# Patient Record
Sex: Male | Born: 1940 | Race: White | Hispanic: No | Marital: Married | Smoking: Never smoker
Health system: Southern US, Community
[De-identification: ages and names within clinical notes are randomized; demographics above are authoritative.]

## PROBLEM LIST (undated history)

## (undated) DIAGNOSIS — Z87442 Personal history of urinary calculi: Secondary | ICD-10-CM

## (undated) DIAGNOSIS — K8020 Calculus of gallbladder without cholecystitis without obstruction: Secondary | ICD-10-CM

## (undated) DIAGNOSIS — M1990 Unspecified osteoarthritis, unspecified site: Secondary | ICD-10-CM

## (undated) DIAGNOSIS — Z7901 Long term (current) use of anticoagulants: Secondary | ICD-10-CM

## (undated) DIAGNOSIS — I10 Essential (primary) hypertension: Secondary | ICD-10-CM

## (undated) DIAGNOSIS — N400 Enlarged prostate without lower urinary tract symptoms: Secondary | ICD-10-CM

## (undated) DIAGNOSIS — I82409 Acute embolism and thrombosis of unspecified deep veins of unspecified lower extremity: Secondary | ICD-10-CM

## (undated) HISTORY — PX: BACK SURGERY: SHX140

## (undated) HISTORY — PX: HERNIA REPAIR: SHX51

## (undated) HISTORY — PX: EYE SURGERY: SHX253

---

## 1999-04-05 ENCOUNTER — Emergency Department (HOSPITAL_COMMUNITY): Admission: EM | Admit: 1999-04-05 | Discharge: 1999-04-05 | Payer: Self-pay | Admitting: Emergency Medicine

## 2000-10-25 ENCOUNTER — Ambulatory Visit (HOSPITAL_COMMUNITY): Admission: RE | Admit: 2000-10-25 | Discharge: 2000-10-25 | Payer: Self-pay | Admitting: Orthopedic Surgery

## 2000-10-25 ENCOUNTER — Encounter: Payer: Self-pay | Admitting: Orthopedic Surgery

## 2000-11-23 ENCOUNTER — Encounter (INDEPENDENT_AMBULATORY_CARE_PROVIDER_SITE_OTHER): Payer: Self-pay

## 2000-11-23 ENCOUNTER — Other Ambulatory Visit: Admission: RE | Admit: 2000-11-23 | Discharge: 2000-11-23 | Payer: Self-pay | Admitting: Urology

## 2000-11-26 ENCOUNTER — Encounter: Payer: Self-pay | Admitting: Orthopedic Surgery

## 2000-11-26 ENCOUNTER — Ambulatory Visit (HOSPITAL_COMMUNITY): Admission: RE | Admit: 2000-11-26 | Discharge: 2000-11-26 | Payer: Self-pay | Admitting: Orthopedic Surgery

## 2000-12-10 ENCOUNTER — Encounter: Payer: Self-pay | Admitting: Orthopedic Surgery

## 2000-12-10 ENCOUNTER — Ambulatory Visit (HOSPITAL_COMMUNITY): Admission: RE | Admit: 2000-12-10 | Discharge: 2000-12-10 | Payer: Self-pay | Admitting: Orthopedic Surgery

## 2001-09-21 ENCOUNTER — Ambulatory Visit (HOSPITAL_COMMUNITY): Admission: RE | Admit: 2001-09-21 | Discharge: 2001-09-21 | Payer: Self-pay | Admitting: Orthopedic Surgery

## 2001-09-21 ENCOUNTER — Encounter: Payer: Self-pay | Admitting: Orthopedic Surgery

## 2001-10-28 ENCOUNTER — Encounter: Payer: Self-pay | Admitting: Orthopedic Surgery

## 2001-10-28 ENCOUNTER — Inpatient Hospital Stay (HOSPITAL_COMMUNITY): Admission: RE | Admit: 2001-10-28 | Discharge: 2001-10-30 | Payer: Self-pay | Admitting: Orthopedic Surgery

## 2001-10-28 ENCOUNTER — Encounter (INDEPENDENT_AMBULATORY_CARE_PROVIDER_SITE_OTHER): Payer: Self-pay | Admitting: *Deleted

## 2001-11-04 ENCOUNTER — Encounter: Payer: Self-pay | Admitting: Emergency Medicine

## 2001-11-04 ENCOUNTER — Inpatient Hospital Stay (HOSPITAL_COMMUNITY): Admission: EM | Admit: 2001-11-04 | Discharge: 2001-11-10 | Payer: Self-pay | Admitting: Emergency Medicine

## 2004-01-31 ENCOUNTER — Encounter: Admission: RE | Admit: 2004-01-31 | Discharge: 2004-01-31 | Payer: Self-pay | Admitting: Emergency Medicine

## 2004-02-13 ENCOUNTER — Encounter: Admission: RE | Admit: 2004-02-13 | Discharge: 2004-02-13 | Payer: Self-pay | Admitting: Emergency Medicine

## 2004-04-15 ENCOUNTER — Encounter: Admission: RE | Admit: 2004-04-15 | Discharge: 2004-04-15 | Payer: Self-pay | Admitting: Emergency Medicine

## 2004-05-29 ENCOUNTER — Encounter: Admission: RE | Admit: 2004-05-29 | Discharge: 2004-05-29 | Payer: Self-pay | Admitting: Emergency Medicine

## 2008-03-16 ENCOUNTER — Ambulatory Visit: Payer: Self-pay | Admitting: Cardiology

## 2008-03-27 ENCOUNTER — Ambulatory Visit: Payer: Self-pay

## 2008-03-27 ENCOUNTER — Encounter: Payer: Self-pay | Admitting: Cardiology

## 2011-04-28 NOTE — Assessment & Plan Note (Signed)
[facility]                            CARDIOLOGY OFFICE NOTE   ELVAN, EBRON                     MRN:          [id number]  DATE:03/16/2008                            DOB:          [birth year]    The patient is a 70 year old male who I am asked to evaluate for an  abnormal electrocardiogram.  He has no prior cardiac history.  He  exercises routinely walking 20 minutes on a treadmill each day and also  lifting weights. He typically does not have dyspnea on exertion,  orthopnea, PND, pedal edema, palpitations, presyncope, syncope or chest  pain.  He does state when he feels stressed he can occasionally feel a  tingling sensation in his chest that lasts for 1-2 seconds and resolves  spontaneously.  There is no associated nausea, vomiting, shortness of  breath or diaphoresis.  The symptoms do not radiate.  Again he does not  have any exertional chest pain.  He was recently seen and there was a  question of an abnormality on his electrocardiogram.  Cardiology was  therefore asked to further evaluate.  His medications include Coumadin  as directed. He does have a history of DVT.  He also is on Avodart. He  has no known drug allergies.   SOCIAL HISTORY:  He does not smoke.  He occasionally consumes alcohol.  His family history is significant for coronary artery disease in his  parents but this is at an older age.  He also had a sister who had heart  failure but this was at 72 years of age.   PAST MEDICAL HISTORY:  Significant for a history of borderline  hypertension by his report.  There is no diabetes mellitus or  hyperlipidemia.  He has a history of an umbilical and inguinal hernia  repair.  He also has had previous back surgery which was complicated by  DVT.  He is on Coumadin for that issue. He has benign prostatic  hypertrophy.   REVIEW OF SYSTEMS:  There is no fevers, chills or productive cough.  There is no dysphagia, odynophagia, melena  or hematochezia. There is no  dysuria or hematuria. There is no rash or seizure activity. There is no  orthopnea, PND or pedal edema.  He can occasionally have pedal edema in  the left lower extremity towards the end of the day which resolves  overnight.  This is related to his previous DVT.  There is no  claudication.  The remaining systems are negative.   PHYSICAL EXAMINATION:  Blood pressure that is elevated at 165/85.  His  blood pressure is 79.  He weighs 184 pounds.  He is well-developed and  well-nourished in no acute distress. Skin is warm and dry.  He does not  appear to be depressed and there is no peripheral clubbing. His back is  normal.  HEENT:  Normal with normal eyelids.  NECK:  Supple with normal upstrokes bilaterally and no bruits noted.  There is no jugular venous distention and no thyromegaly is noted.  CHEST:  Clear to auscultation with normal expansion.  CARDIOVASCULAR:  Regular rate  and rhythm with normal S1 and S2. There  are no murmurs, rubs or gallops noted.  ABDOMEN:  Nontender, nondistended, positive bowel sounds. No  hepatosplenomegaly and no mass appreciated. There is no abdominal bruit.  He has 2+ femoral pulses bilaterally and no bruits.  EXTREMITIES:  No edema and I can palpate no cords. He has 2+ posterior  tibial pulses bilaterally.  NEUROLOGIC:  Grossly intact.   His electrocardiogram  shows a sinus rhythm at a rate of 69. The axis  normal. There is poor R wave progression and a prior anterior infarct  cannot be excluded.   DIAGNOSES:  1. Abnormal electrocardiogram  - Mr. Nazir electrocardiogram      would most appropriately be interpreted as cannot rule out prior      anterior infarct. However, he has no cardiac symptoms including no      dyspnea on exertion and no exertional chest pain. I will schedule      him to have an echocardiogram to evaluate wall motion. If it is      normal then I do not think we need to pursue further cardiac       workup.  2. Hypertension - his blood pressure is elevated today. He states that      it can be occasionally elevated at home. I have asked him to track      this and to followup with Herb Grays concerning this issue. If his      systolic runs greater than 140 then he will most likely an      antihypertensive. I will leave this to her.  3. History of deep venous thrombosis - he will continue on his      Coumadin and this is being managed by Herb Grays as well.   We will see him back on an as needed basis if his echocardiogram is  normal.     Madolyn Frieze. Jens Som, MD, [facility]  Electronically Signed    BSC/MedQ  DD: 03/16/2008  DT: 03/16/2008  Job #: [id number]   cc:   Tammy R. Collins [place], M.D.

## 2011-05-01 NOTE — H&P (Signed)
[facility]  Patient:    Alexander Smith, Alexander Smith Visit Number: [id number] MRN: [id number]          Service Type: MED Location: 2S 0252 01 Attending Physician:  Roque Lias Dictated by:   Reuben Likes, M.D. Admit Date:  11/04/2001                           History and Physical  CHIEF COMPLAINT:  "Left leg swollen and tight."  HISTORY OF PRESENT ILLNESS:  Alexander Smith is a 70 year old male who has had a two-day history of swelling and sensation of a tight feeling in the left calf and thigh.  The leg hurts to move.  The patient had a lumbar laminectomy a week ago done by Windy Fast A. Darrelyn Hillock, M.D.  Postoperatively the patient has severe urinary retention and wore a Foley catheter for several days.  Verl Dicker, M.D., who took out the catheter, put him on Cipro.  He has had a little dysuria since then.  Today he has noted pleuritic left submammary chest pain.  He denies dyspnea, syncope, cough, or hemoptysis.  He has had no prior history of DVT or pulmonary embolus.  A venous Doppler done in the emergency room reveals extensive DVT extending from the calf to the common femoral vein.  PAST SURGICAL HISTORY:  The patient had a herniorrhaphy in 1997.  PAST MEDICAL HISTORY:  Other Hospitalizations:  None.  Other Illnesses:  The patient has had hypertension and was on some medication prior to his surgery, but his blood pressure is down now and he is not taking any medication for that right now.  MEDICATIONS:  Cipro and Percocet.  ALLERGIES:  No known medication allergies.  FAMILY HISTORY:  His mother died at age 24 of an MI.  His father died at age 58 of  unknown causes.  He has nine brothers, one of whom had a CVA and another one died of leukemia.  He has six sisters who are in good health and four sons in good health.  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  He lives in his own home in [place], [place].   He denies the use of alcohol or tobacco.  REVIEW OF SYSTEMS:  No other systemic, skin, eye, ENT, respiratory, cardiovascular, GI, GU, musculoskeletal, or neurological complaints.  PHYSICAL EXAMINATION:  Blood pressure 157/92, pulse 105 and regular, respirations 20, temperature 99.4 degrees.  GENERAL APPEARANCE:  He is alert and in no distress.  Particularly, there was no respiratory distress.  SKIN:  Clear, warm, and dry.  No rash.  HEENT:  Eyes:  Pupils equal, round, and reactive to light.  Full EOMs.  Fundi benign.  He has bilateral cataracts.  ENT:  The TMs are normal.  No intraoral lesions.  The mucous membranes are moist.  The pharynx is clear.  NECK:  Supple.  No adenopathy or mass.  No JVD.  Carotid pulses full without bruits.  The thyroid is normal.  LUNGS:  Clear to auscultation and percussion, although breath sounds were somewhat diminished bilaterally.  There were no wheezes, rales, or rhonchi.  HEART:  Regular rhythm.  No murmurs, rubs, or gallops.  ABDOMEN:  Soft and nontender without organomegaly or mass.  Bowel sounds were normally active.  BACK:  He has a midline scar over the lower lumbar area which was closed with some staples.  There was no evidence of infection.  RECTAL:  No masses.  The prostate is markedly enlarged.  It is moderately tender.  No nodules were felt.  Stools heme negative.  EXTREMITIES:  The left leg is swollen and red.  There is no heat.  There is a palpable cord, induction, and tenderness to touch from the calf on up to the groin.  The Homans sign is negative.  Posterior tibial pulses are full.  NEUROLOGIC:  Alert and oriented x 3.  Speech is clear and appropriate.  No extremity weakness or tremor.  DTRs are 2+ and symmetrical.  Babinskis downgoing.  Cranial nerves are intact.  LABORATORY DATA:  The CBC reveals a hemoglobin of 13.3 and a white count of 10,400.  The BMP revealed a sodium of 129.  IMPRESSION: 1. Deep venous  thrombosis, left leg. 2. Chest pain, probable pulmonary embolus. 3. Herniated nucleus pulposus of lumbosacral spine, status post lumbar    laminectomy. 4. Benign prostatic hypertrophy and prostatitis. 5. History of hypertension. 6. Hyponatremia.  PLAN: 1. Chest CT. 2. Heparin and Coumadin. 3. Continue p.o. Cipro. Dictated by:   Reuben Likes, M.D. Attending Physician:  Roque Lias DD:  11/04/01 TD:  11/05/01 Job: [id number]

## 2011-05-01 NOTE — H&P (Signed)
[facility]  Patient:    Alexander Smith, Alexander Smith Visit Number: [id number] MRN: [id number]          Service Type: Attending:  Georges Lynch. Darrelyn Hillock, M.D. Dictated by:   Ralene Bathe, P.A. Adm. Date:  10/28/01   CC:         Lesle Chris, M.D.  Verl Dicker, M.D.   History and Physical  DATE OF BIRTH:  1941/04/30  CHIEF COMPLAINT:  Back and right leg pain.  HISTORY OF PRESENT ILLNESS:  This is a 70 year old male who has had a right radicular leg pain with documented herniated nucleus pulposus at L4-5 on the right.  He has undergone extensive conservative management including therapy, epidural steroids, as well as elective nerve root blocks.  He has been on conservative medications with NSAIDS without relief.  He has demonstrated positive straight leg raise on the right, decreased sensation in the right leg compared to the left with intact reflexes.  Repeat MRI did show a herniated disk at L4-5 on the right.  Decision at this time, as he is significantly uncomfortable and unable to gain any sort of relief, is to proceed with surgical decompression.  Risks and benefits have been discussed with the patient at length and he is in agreement and wishes to proceed.  PAST MEDICAL HISTORY:  BPH.  PAST SURGICAL HISTORY:  Hernia repair.  MEDICATIONS:  Percocet p.r.n.  ALLERGIES:  No known drug allergies.  SOCIAL HISTORY:  He is married.  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  He does not smoke or drink.  PRIMARY PHYSICIAN:  Lesle Chris.  UROLOGIST:  Verl Dicker, M.D.  FAMILY HISTORY:  Denies any history of cancers, heart disease, or diabetes.  REVIEW OF SYSTEMS:  The patient has had a recent abscess tooth, treated with a course of antibiotics and completed two weeks ago, and recheck showed this to be cleared.  RESPIRATORY:  No shortness of breath, productive cough, or hemoptysis.  CARDIOVASCULAR:  No chest pain,  angina, or orthopnea. GASTROINTESTINAL:  No nausea, vomiting, no bloody stools.  GENITOURINARY:  He does have urinary frequency as well as nocturia and difficulty starting his stream, which is being looked into by Dr. Boston Service.  PHYSICAL EXAMINATION:  GENERAL:  Well-developed, well-nourished, 70 year old male who walks with an antalgic gait due to his back pain.  VITAL SIGNS:  Multiple blood pressure checks showed his systolic blood pressure to be consistently elevated at 172 up to 182 on the right arm, and 184 to 174 in the left arm, over 94.  NECK:  Supple, no lymphadenopathy, no carotid bruits appreciated on exam.  CHEST:  Clear to auscultation bilaterally.  No rales or rhonchi.  HEART:  Regular rate and rhythm.  No murmurs, gallops, rubs, heaves, or thrills.  ABDOMEN:  Positive bowel sounds, soft, nontender.  EXTREMITIES:  As in history of present illness.  IMPRESSION: 1. Herniated nucleus pulposus, L4-5. 2. Benign prostatic hypertrophy. 3. Systolic high blood pressure.  PLAN:  At this time we will proceed with microdiskectomy at L4-5 on the right on Friday.  I did give him a prescription with this blood pressure reading and he is going by his medical doctor today.  Would like possibly to be started on something preoperatively.  A good deal of his systolic elevation could be from pain control.  At this time we will proceed with surgical decompression and his blood pressure medicines will be per Dr.  Lesle Chris. Dictated by:   Ralene Bathe, P.A. Attending:  Georges Lynch. Darrelyn Hillock, M.D. DD:  10/25/01 TD:  10/25/01 Job: [id number]

## 2011-05-01 NOTE — Discharge Summary (Signed)
[facility]  Patient:    Alexander Smith, Alexander Smith Visit Number: [id number] MRN: [id number]          Service Type: MED Location: 3W 0372 01 Attending Physician:  Roque Lias Dictated by:   Reuben Likes, M.D. Admit Date:  11/04/2001 Discharge Date: 11/10/2001                             Discharge Summary  HISTORY OF PRESENT ILLNESS:  Mr. Froning is a 70 year old male with a two-day history of swelling and a sensation of a tight feeling in the left calf and thigh.  The leg hurts him to move.  The patient had a lumbar laminectomy a week prior to admission done by Dr. Worthy Rancher.  The patient had some urinary retention postoperatively and wore a Foley catheter for several days, then he saw Dr. Wanda Plump who put him on Cipro.  Today, the patient notes pleuritic left submammary chest pain.  He denies any dyspnea, syncope, cough, or hemoptysis.  He has no prior history of DVT or pulmonary embolus.  Venous Doppler done in the emergency room revealed DVT extending from the calf to the common femoral vein.  PHYSICAL EXAMINATION:  Blood pressure was 157/92, pulse 105 and regular, respirations 20, temperature 99.4.  In general, the patient appeared alert and in no obvious respiratory distress.  His skin was warm and dry.  Pupils were equal, round, and reactive to light.  He had a full range of motion of EOMs and fundi were benign.  TMs and oropharynx were clear.  There was no cervical tenderness or mass.  Thyroid was normal.  Carotid pulses were full without bruits.  Lungs were clear to auscultation and percussion without wheezes, rales, rhonchi, or rubs.  Cardiac rhythm was regular without murmur or rub. Abdomen was soft and nontender without organomegaly or mass.  Extremity exam revealed the left leg to be swollen, red, and tender.  There was no sensation of heat.  There was a palpable cord, induration, and tenderness to touch from the calf to the groin  area.  Homans sign was negative.  Posterior tibial pulses were full.  His neurologic exam was within normal limits.  ADMITTING IMPRESSION: 1. Deep venous thrombosis of left leg. 2. Chest pain, probably pulmonary embolus. 3. History of herniated nucleus pulposus status post lumbar laminectomy. 4. History of benign prostatic hypertrophy and prostatitis. 5. History of hypertension. 6. Hyponatremia.  LABORATORY SUMMARY:  On room air, his pH was 7.451, PO2 was 68.3, and PCO2 was 38.7.  His hemoglobin was 13.3, white count 10,400.  His D-dimer was elevated at 14.8.  His baseline pro time was 13.8 with INR of 1.1 and the PTT was 40 at baseline.  Serum sodium on admission was 129.  This has come up to 131 on November 05, 2001.  His potassium was 4.4 on admission.  CO2 was 26, glucose 112, BUN 16, creatinine 0.9.  His UA showed a specific gravity of greater than 1.040 but otherwise was negative.  His urine culture showed no growth.  A venous Doppler revealed DVT from the distal calf extending to the popliteal vein to the common femoral vein.  An EKG showed normal sinus rhythm, possible anterior infarct, age undetermined, also the computer readout said cannot rule out inferior infarct. There were no acute changes noted.  His chest CT revealed pulmonary emboli in pulmonary artery branches of both lower lobes and a CT  scan of the leg revealed extensive DVT of the left leg.  HOSPITAL COURSE:  The patient was hospitalized on a ICU bed and vital signs were obtained every two hours with O2 saturations for 12 hours, then every 4 hours.  He was put at bed rest, given IV D-5 half normal saline at 75 cc an hour and oxygen at 2 L per minute via nasal cannula and a regular diet and a ______ pad to his leg.  He was begun on Cipro 500 mg b.i.d., Percocet for pain, Ambien for sleep, and heparin of pharmacy protocol, then Coumadin was given on the day of admission, 10 mg on the first day.  By the  following morning, the patient states his leg was still swollen and tight.  His chest pain was better.  He had no dyspnea or hemoptysis.  His O2 saturation was 98% on 2 L per minute.  His sodium had come up to 131 and he was continued on heparin and Coumadin.  As of November 24, his pleuritic left chest pain was a little bit worse. Prednisone was started since his pain was not well controlled on pain medications.  Thereafter, his pain gradually improved.  His INR became therapeutic on November 27.  He was transferred off of the intensive care unit to a regular bed.  On November 24, activity was increased gradually.  As of November 08, 2001, his pleuritic pain was better.  He was allowed to ambulate in the hall.  He was put in a thigh high TED hose.  His dose of Coumadin was adjusted to maintain therapeutic levels for three consecutive days and, as of November 10, 2001, his chest pain was better but not entirely gone.  He was off oxygen ambulating the hall.  He had no dyspnea.  He had a cough productive of small amounts of clear sputum but no blood.  His left leg was still swollen with medial thigh soreness.  His O2 saturation on room air was 95%.  His lungs were clear.  His cardiac rhythm was regular without gallop or murmur.  His abdomen was soft and nontender.  His left leg was swollen with slight medial thigh tenderness and his INR was 3.8.  It was felt he could be discharged on that date.  Coumadin was held because his INR was a little bit high and was to be restarted the next day at 4 mg per day.  He is to return to the office in a week for follow-up.  DISCHARGE DIAGNOSES: 1. Deep venous thrombophlebitis. 2. Pulmonary embolus.  3. Hyponatremia. 4. Benign prostatic hypertrophy. 5. History of hypertension.  MEDICATIONS: 1. Coumadin 2 mg two tablets daily. 2. Prednisone 5 mg three daily for three days, two daily for three days, one    daily for three days, then stop. 3. Cipro 500  mg b.i.d. 4. Percocet as needed.  ACTIVITY:  He was instructed not to drive for two weeks and no work until further notice.   DIET:  He was given a diet sheet for Coumadin restrictions.  Wound care to his back was as per Dr. Darrelyn Hillock.  SPECIAL INSTRUCTIONS:  Wear support hose when up.  FOLLOW-UP:  He is to see me the following Tuesday and he is to call for an appointment, also to follow up with Dr. Wanda Plump and Dr. Darrelyn Hillock per their recommendations. Dictated by:   Reuben Likes, M.D. Attending Physician:  Roque Lias DD:  12/03/01 TD:  12/05/01 Job: [id number]

## 2011-05-01 NOTE — Op Note (Signed)
[facility]  Patient:    Alexander Smith, Alexander Smith Visit Number: [id number] MRN: [id number]          Service Type: OBV Location: 4W 0453 01 Attending Physician:  Skip Mayer Dictated by:   Georges Lynch Darrelyn Hillock, M.D. Proc. Date: 10/28/01 Admit Date:  10/28/2001                             Operative Report  PREOPERATIVE DIAGNOSES: 1. 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 2. Herniated disk at L4-L5 on the right with a foraminal component.  POSTOPERATIVE DIAGNOSES: 1. 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 2. Herniated disk at L4-L5 on the right with a foraminal component.  OPERATION:  SURGEON:  Georges Lynch. Gioffre, M.D.  ASSISTANT:  Philips J. Montez Morita, M.D.  ANESTHESIA: General.  INDICATIONS:  Note, the patient had right leg pain only and weakness of his dorsiflexors and toe extensors of his right foot only.  DESCRIPTION OF PROCEDURE:   Under general anesthesia, the patient on the spinal frame under routine orthopedic prep and draping of the lower back was carried out.  Two needles were placed in the back for localization purposes. At this time, an incision was made over the L4-L5 interspace.  We went down and skipped a muscle from the lamina of the spinous process and another x-ray was taken and verified that we were at the L4-L5 space on the right.  At this time, I utilized the bur to bur out the lateral recess as well as the L4 lamina to do a hemilaminectomy.  The lamina was extremely thick and he was severely stenotic in the area.  Following this, we then went on and completed our hemilaminectomy.  We did a facetectomy at L4-5 inferiorly and then identified the L4 root and then the L5 root.  The ligament of flavum was removed as well with great care taken to protect the underlying dura.  Once we identified the L4 root we did a foraminotomy for the L4 root and also for the L5 root.  We then isolated the disk.  We cauterized  the lateral recess veins with a bipolar.  We went down and made a cruciate incision in the L4-L5 disk. We did a complete diskectomy.  We were able to utilize the Epstein curets to go out laterally as well as medially and decompress the migrated portions of the disk fragments.  We were able to easily decompress the disk space.  We made multiple passes in the space to remove the disk.  The L4 root and the L5 root were now quite free.  I thoroughly irrigated out the area.  We then took the hockey stick probe, the L5 root and also the L4 root.  They were now quite free.  I then inserted some thrombin soaked Gelfoam, loosely applied that, closed the wound in layers in the usual fashion.  A sterile Neosporin dressing was applied and the patient left the operating room in satisfactory condition. He had 1 g of IV Ancef preoperatively. Dictated by:   Georges Lynch Darrelyn Hillock, M.D. Attending Physician:  Skip Mayer DD:  10/28/01 TD:  10/28/01 Job: [id number]

## 2012-08-03 ENCOUNTER — Encounter (HOSPITAL_BASED_OUTPATIENT_CLINIC_OR_DEPARTMENT_OTHER): Payer: Self-pay

## 2012-08-03 ENCOUNTER — Emergency Department (HOSPITAL_BASED_OUTPATIENT_CLINIC_OR_DEPARTMENT_OTHER)
Admission: EM | Admit: 2012-08-03 | Discharge: 2012-08-03 | Disposition: A | Discharge status: Home / Self Care | Payer: Medicare HMO | Attending: Emergency Medicine | Admitting: Emergency Medicine

## 2012-08-03 DIAGNOSIS — N400 Enlarged prostate without lower urinary tract symptoms: Secondary | ICD-10-CM | POA: Insufficient documentation

## 2012-08-03 DIAGNOSIS — W260XXA Contact with knife, initial encounter: Secondary | ICD-10-CM | POA: Insufficient documentation

## 2012-08-03 DIAGNOSIS — I10 Essential (primary) hypertension: Secondary | ICD-10-CM | POA: Insufficient documentation

## 2012-08-03 DIAGNOSIS — S61218A Laceration without foreign body of other finger without damage to nail, initial encounter: Secondary | ICD-10-CM

## 2012-08-03 DIAGNOSIS — S61209A Unspecified open wound of unspecified finger without damage to nail, initial encounter: Secondary | ICD-10-CM | POA: Insufficient documentation

## 2012-08-03 DIAGNOSIS — Z86718 Personal history of other venous thrombosis and embolism: Secondary | ICD-10-CM | POA: Insufficient documentation

## 2012-08-03 HISTORY — DX: Essential (primary) hypertension: I10

## 2012-08-03 HISTORY — DX: Benign prostatic hyperplasia without lower urinary tract symptoms: N40.0

## 2012-08-03 HISTORY — DX: Acute embolism and thrombosis of unspecified deep veins of unspecified lower extremity: I82.409

## 2012-08-03 NOTE — ED Provider Notes (Signed)
History     CSN: [id number]  Arrival date & time 08/03/12  1358   First MD Initiated Contact with Patient 08/03/12 1500      Chief Complaint  Patient presents with  . Extremity Laceration    (Consider location/radiation/quality/duration/timing/severity/associated sxs/prior treatment) Patient is a 71 y.o. male presenting with skin laceration. The history is provided by the patient.  Laceration  The incident occurred less than 1 hour ago. Pain location: left index finger. Size: 1.5 cm. Injury mechanism: cut with utility knife while cutting wire. The pain is mild. The pain has been constant since onset. He reports no foreign bodies present. His tetanus status is UTD.    Past Medical History  Diagnosis Date  . Hypertension   . DVT (deep venous thrombosis)   . Prostate enlargement     Past Surgical History  Procedure Date  . Back surgery   . Hernia repair     No family history on file.  History  Substance Use Topics  . Smoking status: Never Smoker   . Smokeless tobacco: Not on file  . Alcohol Use: No      Review of Systems  All other systems reviewed and are negative.    Allergies  Review of patient's allergies indicates no known allergies.  Home Medications   Current Outpatient Rx  Name Route Sig Dispense Refill  . FINASTERIDE PO Oral Take by mouth.    . METOPROLOL TARTRATE PO Oral Take by mouth.    . WARFARIN SODIUM PO Oral Take by mouth.      BP 152/84  Pulse 70  Temp 98 F (36.7 C) (Oral)  Resp 20  Ht 5\' 9"  (1.753 m)  Wt 186 lb (84.369 kg)  BMI 27.47 kg/m2  SpO2 100%  Physical Exam  Nursing note and vitals reviewed. Constitutional: He is oriented to person, place, and time. He appears well-developed and well-nourished.  HENT:  Head: Normocephalic and atraumatic.  Neck: Normal range of motion. Neck supple.  Musculoskeletal:       There is a 1.5 cm laceration to the dorsum of the left index finger near the dip joint.  There is no tendon  involvement both to apposition and inspection.  Neurological: He is alert and oriented to person, place, and time.  Skin: Skin is warm and dry.    ED Course  Procedures (including critical care time)  Labs Reviewed - No data to display No results found.   No diagnosis found.  LACERATION REPAIR Performed by: Geoffery Lyons Authorized by: Geoffery Lyons Consent: Verbal consent obtained. Risks and benefits: risks, benefits and alternatives were discussed Consent given by: patient Patient identity confirmed: provided demographic data Prepped and Draped in normal sterile fashion Wound explored  Laceration Location: left index finger  Laceration Length: 1.5cm  No Foreign Bodies seen or palpated  Anesthesia: local infiltration  Local anesthetic: lidocaine 1% without epinephrine  Anesthetic total: 1 ml  Irrigation method: syringe Amount of cleaning: standard  Skin closure: prolene 4-0  Number of sutures: 5  Technique: simple interrupted  Patient tolerance: Patient tolerated the procedure well with no immediate complications.  MDM  Local wound care, sutures out in 7 days.  Return prn for sign of infection.        Geoffery Lyons, MD 08/03/12 [phone]

## 2012-08-03 NOTE — ED Notes (Signed)
Cut left index finger with a knife-approx 1pm

## 2013-10-23 ENCOUNTER — Emergency Department (HOSPITAL_BASED_OUTPATIENT_CLINIC_OR_DEPARTMENT_OTHER)
Admission: EM | Admit: 2013-10-23 | Discharge: 2013-10-23 | Disposition: A | Discharge status: Home / Self Care | Payer: Medicare HMO | Attending: Emergency Medicine | Admitting: Emergency Medicine

## 2013-10-23 ENCOUNTER — Encounter (HOSPITAL_BASED_OUTPATIENT_CLINIC_OR_DEPARTMENT_OTHER): Payer: Self-pay | Admitting: Emergency Medicine

## 2013-10-23 ENCOUNTER — Emergency Department (HOSPITAL_BASED_OUTPATIENT_CLINIC_OR_DEPARTMENT_OTHER): Payer: Medicare HMO

## 2013-10-23 DIAGNOSIS — Z86718 Personal history of other venous thrombosis and embolism: Secondary | ICD-10-CM | POA: Insufficient documentation

## 2013-10-23 DIAGNOSIS — S61409A Unspecified open wound of unspecified hand, initial encounter: Secondary | ICD-10-CM | POA: Insufficient documentation

## 2013-10-23 DIAGNOSIS — Y929 Unspecified place or not applicable: Secondary | ICD-10-CM | POA: Insufficient documentation

## 2013-10-23 DIAGNOSIS — Y9389 Activity, other specified: Secondary | ICD-10-CM | POA: Insufficient documentation

## 2013-10-23 DIAGNOSIS — S61412A Laceration without foreign body of left hand, initial encounter: Secondary | ICD-10-CM

## 2013-10-23 DIAGNOSIS — S61419A Laceration without foreign body of unspecified hand, initial encounter: Secondary | ICD-10-CM

## 2013-10-23 DIAGNOSIS — Z7901 Long term (current) use of anticoagulants: Secondary | ICD-10-CM | POA: Insufficient documentation

## 2013-10-23 DIAGNOSIS — I10 Essential (primary) hypertension: Secondary | ICD-10-CM | POA: Insufficient documentation

## 2013-10-23 DIAGNOSIS — Z79899 Other long term (current) drug therapy: Secondary | ICD-10-CM | POA: Insufficient documentation

## 2013-10-23 DIAGNOSIS — Z87448 Personal history of other diseases of urinary system: Secondary | ICD-10-CM | POA: Insufficient documentation

## 2013-10-23 DIAGNOSIS — W2209XA Striking against other stationary object, initial encounter: Secondary | ICD-10-CM | POA: Insufficient documentation

## 2013-10-23 MED ORDER — CEPHALEXIN 500 MG PO CAPS: 500.0000 mg | ORAL_CAPSULE | Freq: Four times a day (QID) | ORAL | Status: AC

## 2013-10-23 NOTE — ED Notes (Signed)
Cutting wood  And metal wedge flew out and struck left hand     laceration

## 2013-10-23 NOTE — ED Provider Notes (Addendum)
CSN: [id number]     Arrival date & time 10/23/13  1708 History  This chart was scribed for Junius Argyle, MD by Blanchard Kelch, ED Scribe. The patient was seen in room MH02/MH02. Patient's care was started at 6:15 PM.    Chief Complaint  Patient presents with  . Hand Injury    Patient is a 72 y.o. male presenting with hand injury. The history is provided by the patient. No language interpreter was used.  Hand Injury Location:  Hand Time since incident:  1 hour Injury: yes   Hand location:  L hand Pain details:    Severity:  No pain Handedness:  Right-handed Tetanus status:  Up to date Associated symptoms: no fever     HPI Comments: Alexander Smith is a 72 y.o. male who presents to the Emergency Department due to a left hand injury that occurred about an hour ago. He states that he was splitting wood with a metal wedge that you hit with a sledge hammer when it popped out and hit the dorsum of his left hand. He denies any pain to the area but has a laceration between his third and fourth fingers. His friend states that it is possible some of the wedge may have broken off and there could be a foreign body in the laceration, although there isn't one visible. The bleeding is currently controlled. He states that he believes his tetanus vaccination is up to date. He is right handed. 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      Past Medical History  Diagnosis Date  . Hypertension   . DVT (deep venous thrombosis)   . Prostate enlargement    Past Surgical History  Procedure Laterality Date  . Back surgery    . Hernia repair     History reviewed. No pertinent family history. History  Substance Use Topics  . Smoking status: Never Smoker   . Smokeless tobacco: Not on file  . Alcohol Use: No    Review of Systems  Constitutional: Negative for  fever.  HENT: Negative for drooling.   Eyes: Negative for discharge.  Respiratory: Negative for cough.   Cardiovascular: Negative for leg swelling.  Gastrointestinal: Negative for vomiting.  Endocrine: Negative for polyuria.  Genitourinary: Negative for hematuria.  Musculoskeletal: Negative for gait problem.  Skin: Positive for wound. Negative for rash.  Allergic/Immunologic: Negative for immunocompromised state.  Neurological: Negative for speech difficulty.  Hematological: Negative for adenopathy.  Psychiatric/Behavioral: Negative for confusion.  All other systems reviewed and are negative.    Allergies  Review of patient's allergies indicates no known allergies.  Home Medications   Current Outpatient Rx  Name  Route  Sig  Dispense  Refill  . FINASTERIDE PO   Oral   Take by mouth.         . METOPROLOL TARTRATE PO   Oral   Take by mouth.         . WARFARIN SODIUM PO   Oral   Take by mouth.          Triage Vitals: BP 157/85  Pulse 69  Temp(Src) 97.7 F (36.5 C) (Oral)  Resp 20  Ht 5\' 9"  (1.753 m)  Wt 180 lb (81.647 kg)  BMI 26.57 kg/m2  SpO2 99%  Physical Exam  Nursing note and vitals reviewed. Constitutional: He  is oriented to person, place, and time. He appears well-developed and well-nourished. No distress.  HENT:  Head: Normocephalic and atraumatic.  Mouth/Throat: Oropharynx is clear and moist.  Eyes: EOM are normal. Pupils are equal, round, and reactive to light.  Neck: Neck supple. No tracheal deviation present.  Cardiovascular: Normal rate.   Pulmonary/Chest: Effort normal. No respiratory distress.  Musculoskeletal: Normal range of motion.  Neurological: He is alert and oriented to person, place, and time.  Skin: Skin is warm and dry.  Curved 2 cm laceration on the dorsal aspect of the left hand superior to the 4th MCP.   Mild erythema around the lateral nail margin of the 5th digit on the left hand.  Psychiatric: He has a normal mood and  affect. His behavior is normal.    ED Course  LACERATION REPAIR Date/Time: 10/23/2013 10:42 PM Performed by: Purvis Sheffield, S Authorized by: Purvis Sheffield, S Consent: Verbal consent obtained. Risks and benefits: risks, benefits and alternatives were discussed Consent given by: patient Required items: required blood products, implants, devices, and special equipment available Patient identity confirmed: verbally with patient, arm band, provided demographic data and hospital-assigned identification number Body area: upper extremity Location details: left hand Laceration length: 2 cm Foreign bodies: no foreign bodies Tendon involvement: none Nerve involvement: none Vascular damage: no Anesthesia: local infiltration Local anesthetic: lidocaine 1% without epinephrine Patient sedated: no Preparation: Patient was prepped and draped in the usual sterile fashion. Irrigation solution: saline Irrigation method: syringe Amount of cleaning: standard Debridement: minimal Degree of undermining: none Skin closure: 4-0 Prolene Number of sutures: 2 Technique: simple Approximation: close Approximation difficulty: simple Dressing: 4x4 sterile gauze Patient tolerance: Patient tolerated the procedure well with no immediate complications.   (including critical care time)  DIAGNOSTIC STUDIES: Oxygen Saturation is 99% on room air, normal by my interpretation.    COORDINATION OF CARE: 6:27 PM -Will repair laceration on left hand. Patient verbalizes understanding and agrees with treatment plan.    Labs Review Labs Reviewed - No data to display Imaging Review Dg Hand Complete Left  10/23/2013   CLINICAL DATA:  Left hand laceration between the 1st and 2nd metacarpals from a metallic instrument.  EXAM: LEFT HAND - COMPLETE 3+ VIEW  COMPARISON:  None.  FINDINGS: Triangular-shaped metallic foreign body ventral to the radial aspect of the distal aspect of the 2nd metacarpal. A 2nd smaller  metallic foreign body more superficially at the ulnar aspect of the base of the thumb. On 2 of the views, there is also a poorly defined curvilinear calcific density projected between the 2nd and 1st metacarpals. No visible fracture and no dislocation.  IMPRESSION: Metallic foreign bodies and possible calcific foreign body or bone fragment, as described above.   Electronically Signed   By: Gordan Payment M.D.   On: 10/23/2013 17:44    EKG Interpretation   None       MDM   1. Hand laceration, left, initial encounter    6:30 PM 72 y.o. male who presents with a laceration to the dorsal surface of his left hand which occurred while splitting wood prior to arrival. He states that his tetanus is up-to-date. He is neurovascularly intact on exam. There are several metallic foreign bodies noted on the x-ray. These appear to be old as they are not close to the patient's new injury. The patient gave verbal consent to perform laceration repair.  7:03 PM:  I have discussed the diagnosis/risks/treatment options with the patient and believe the pt  to be eligible for discharge home to follow-up with pcp or here in 7-10 days for suture removal. I educated him on hand infections and what to look out for. I notified him that despite sterile precautions and proper wound cleaning that hand infections frequently become infected and to return for any concern of this. Will place on abx for infection ppx. We also discussed returning to the ED immediately if new or worsening sx occur. We discussed the sx which are most concerning (e.g., worsening pain, redness, swelling, purulence) that necessitate immediate return. Any new prescriptions provided to the patient are listed below.  New Prescriptions   CEPHALEXIN (KEFLEX) 500 MG CAPSULE    Take 1 capsule (500 mg total) by mouth 4 (four) times daily.      I personally performed the services described in this documentation, which was scribed in my presence. The recorded  information has been reviewed and is accurate.    Junius Argyle, MD 10/23/13 1610  Junius Argyle, MD 10/23/13 [phone]

## 2013-10-23 NOTE — ED Notes (Signed)
Dr. Romeo Apple at bedside for wound repair

## 2014-07-20 ENCOUNTER — Other Ambulatory Visit: Payer: Self-pay | Admitting: Surgical

## 2014-07-20 ENCOUNTER — Encounter (HOSPITAL_COMMUNITY): Payer: Self-pay | Admitting: Pharmacy Technician

## 2014-07-23 ENCOUNTER — Other Ambulatory Visit (HOSPITAL_COMMUNITY): Payer: Self-pay | Admitting: Orthopedic Surgery

## 2014-07-23 NOTE — Patient Instructions (Addendum)
Your procedure is scheduled on:  07/26/14  THURSDAY  Report to [facility]-- MAIN ENTRANCE- FOLLOW SIGNS TO SHORT STAY CENTER Short Stay Center at   1240  PM  Call this number if you have problems the morning of surgery: [phone]        Do not eat food  After Midnight. Wednesday NIGHT.   MAY HAVE CLEAR LIQUIDS Thursday MORNING UNTIL 08:30 AM-- THEN NOTHING BY MOUTH   Take these medicines the morning of surgery with A SIP OF WATER: METOPROLOL, PROSCAR, TYLENOL   .  Contacts, dentures or partial plates, or metal hairpins  can not be worn to surgery. Your family will be responsible for glasses, dentures, hearing aides while you are in surgery  Leave suitcase in the car. After surgery it may be brought to your room.  For patients admitted to the hospital, checkout time is 11:00 AM day of  discharge.         [facility] IS NOT RESPONSIBLE FOR ANY VALUABLES                                                                                                                                   [facility] - Preparing for Surgery Before surgery, you can play an important role.  Because skin is not sterile, your skin needs to be as free of germs as possible.  You can reduce the number of germs on your skin by washing with CHG (chlorahexidine gluconate) soap before surgery.  CHG is an antiseptic cleaner which kills germs and bonds with the skin to continue killing germs even after washing. Please DO NOT use if you have an allergy to CHG or antibacterial soaps.  If your skin becomes reddened/irritated stop using the CHG and inform your nurse when you arrive at Short Stay. Do not shave (including legs and underarms) for at least 48 hours prior to the first CHG shower.  You may shave your face/neck. Please follow these instructions carefully:  1.  Shower with CHG Soap the night before surgery and the  morning of Surgery.  2.  If you choose to wash your hair, wash your hair first as usual with  your  normal  shampoo.  3.  After you shampoo, rinse your hair and body thoroughly to remove the  shampoo.                           4.  Use CHG as you would any other liquid soap.  You can apply chg directly  to the skin and wash                       Gently with a scrungie or clean washcloth.  5.  Apply the CHG Soap to your body ONLY FROM THE NECK DOWN.   Do not use on face/  open                           Wound or open sores. Avoid contact with eyes, ears mouth and genitals (private parts).                       Wash face,  Genitals (private parts) with your normal soap.             6.  Wash thoroughly, paying special attention to the area where your surgery  will be performed.  7.  Thoroughly rinse your body with warm water from the neck down.  8.  DO NOT shower/wash with your normal soap after using and rinsing off  the CHG Soap.                9.  Pat yourself dry with a clean towel.            10.  Wear clean pajamas.            11.  Place clean sheets on your bed the night of your first shower and do not  sleep with pets. Day of Surgery : Do not apply any lotions/deodorants the morning of surgery.  Please wear clean clothes to the hospital/surgery center.  FAILURE TO FOLLOW THESE INSTRUCTIONS MAY RESULT IN THE CANCELLATION OF YOUR SURGERY PATIENT SIGNATURE_________________________________  NURSE SIGNATURE__________________________________  ________________________________________________________________________    CLEAR LIQUID DIET   Foods Allowed                                                                     Foods Excluded  Coffee and tea, regular and decaf                             liquids that you cannot  Plain Jell-O in any flavor                                             see through such as: Fruit ices (not with fruit pulp)                                     milk, soups, orange juice  Iced Popsicles                                    All solid food Carbonated  beverages, regular and diet                                    Cranberry, grape and apple juices Sports drinks like Gatorade Lightly seasoned clear broth or consume(fat free) Sugar, honey syrup  Sample Menu Breakfast  Lunch                                     Supper Cranberry juice                    Beef broth                            Chicken broth Jell-O                                     Grape juice                           Apple juice Coffee or tea                        Jell-O                                      Popsicle                                                Coffee or tea                        Coffee or tea  _____________________________________________________________________    Incentive Spirometer  An incentive spirometer is a tool that can help keep your lungs clear and active. This tool measures how well you are filling your lungs with each breath. Taking long deep breaths may help reverse or decrease the chance of developing breathing (pulmonary) problems (especially infection) following:  A long period of time when you are unable to move or be active. BEFORE THE PROCEDURE   If the spirometer includes an indicator to show your best effort, your nurse or respiratory therapist will set it to a desired goal.  If possible, sit up straight or lean slightly forward. Try not to slouch.  Hold the incentive spirometer in an upright position. INSTRUCTIONS FOR USE  1. Sit on the edge of your bed if possible, or sit up as far as you can in bed or on a chair. 2. Hold the incentive spirometer in an upright position. 3. Breathe out normally. 4. Place the mouthpiece in your mouth and seal your lips tightly around it. 5. Breathe in slowly and as deeply as possible, raising the piston or the ball toward the top of the column. 6. Hold your breath for 3-5 seconds or for as long as possible. Allow the piston or ball to fall to the bottom of the  column. 7. Remove the mouthpiece from your mouth and breathe out normally. 8. Rest for a few seconds and repeat Steps 1 through 7 at least 10 times every 1-2 hours when you are awake. Take your time and take a few normal breaths between deep breaths. 9. The spirometer may include an indicator to show your best effort. Use the indicator as a goal to work toward during each repetition. 10. After each set of 10 deep breaths,  practice coughing to be sure your lungs are clear. If you have an incision (the cut made at the time of surgery), support your incision when coughing by placing a pillow or rolled up towels firmly against it. Once you are able to get out of bed, walk around indoors and cough well. You may stop using the incentive spirometer when instructed by your caregiver.  RISKS AND COMPLICATIONS  Take your time so you do not get dizzy or light-headed.  If you are in pain, you may need to take or ask for pain medication before doing incentive spirometry. It is harder to take a deep breath if you are having pain. AFTER USE  Rest and breathe slowly and easily.  It can be helpful to keep track of a log of your progress. Your caregiver can provide you with a simple table to help with this. If you are using the spirometer at home, follow these instructions: SEEK MEDICAL CARE IF:   You are having difficultly using the spirometer.  You have trouble using the spirometer as often as instructed.  Your pain medication is not giving enough relief while using the spirometer.  You develop fever of 100.5 F (38.1 C) or higher. SEEK IMMEDIATE MEDICAL CARE IF:   You cough up bloody sputum that had not been present before.  You develop fever of 102 F (38.9 C) or greater.  You develop worsening pain at or near the incision site. MAKE SURE YOU:   Understand these instructions.  Will watch your condition.  Will get help right away if you are not doing well or get worse. Document Released:  04/12/2007 Document Revised: 02/22/2012 Document Reviewed: 06/13/2007 [facility] Patient Information 2014 [place], [place].   ________________________________________________________________________

## 2014-07-23 NOTE — Progress Notes (Signed)
AMBER--IF YOU WANT SCD'S FOR THIS PATIENT PLEASE PUT IN ORDER.Alexander Smith. THANKS

## 2014-07-23 NOTE — Progress Notes (Signed)
CBC. CMP on chart 07/18/14 with clearance and note on chart Dr Yehuda BuddSpears

## 2014-07-24 ENCOUNTER — Encounter (HOSPITAL_COMMUNITY)
Admission: RE | Admit: 2014-07-24 | Discharge: 2014-07-24 | Disposition: A | Discharge status: Home / Self Care | Payer: Medicare HMO | Source: Ambulatory Visit | Attending: Orthopedic Surgery | Admitting: Orthopedic Surgery

## 2014-07-24 ENCOUNTER — Ambulatory Visit (HOSPITAL_COMMUNITY)
Admission: RE | Admit: 2014-07-24 | Discharge: 2014-07-24 | Disposition: A | Discharge status: Home / Self Care | Payer: Medicare HMO | Source: Ambulatory Visit | Attending: Anesthesiology | Admitting: Anesthesiology

## 2014-07-24 ENCOUNTER — Encounter (HOSPITAL_COMMUNITY): Payer: Self-pay

## 2014-07-24 DIAGNOSIS — Z01818 Encounter for other preprocedural examination: Secondary | ICD-10-CM | POA: Insufficient documentation

## 2014-07-24 DIAGNOSIS — Z01810 Encounter for preprocedural cardiovascular examination: Secondary | ICD-10-CM | POA: Insufficient documentation

## 2014-07-24 DIAGNOSIS — Z01812 Encounter for preprocedural laboratory examination: Secondary | ICD-10-CM | POA: Insufficient documentation

## 2014-07-24 DIAGNOSIS — M47814 Spondylosis without myelopathy or radiculopathy, thoracic region: Secondary | ICD-10-CM | POA: Diagnosis not present

## 2014-07-24 DIAGNOSIS — I771 Stricture of artery: Secondary | ICD-10-CM | POA: Diagnosis not present

## 2014-07-24 HISTORY — DX: Personal history of urinary calculi: Z87.442

## 2014-07-24 HISTORY — DX: Unspecified osteoarthritis, unspecified site: M19.90

## 2014-07-24 HISTORY — DX: Long term (current) use of anticoagulants: Z79.01

## 2014-07-24 LAB — URINALYSIS, ROUTINE W REFLEX MICROSCOPIC
Bilirubin Urine: NEGATIVE
Glucose, UA: NEGATIVE mg/dL
Hgb urine dipstick: NEGATIVE
Ketones, ur: NEGATIVE mg/dL
Nitrite: NEGATIVE
Protein, ur: NEGATIVE mg/dL
Specific Gravity, Urine: 1.025 (ref 1.005–1.030)
Urobilinogen, UA: 0.2 mg/dL (ref 0.0–1.0)
pH: 6.5 (ref 5.0–8.0)

## 2014-07-24 LAB — PROTIME-INR
INR: 1.54 — ABNORMAL HIGH (ref 0.00–1.49)
Prothrombin Time: 18.5 seconds — ABNORMAL HIGH (ref 11.6–15.2)

## 2014-07-24 LAB — URINE MICROSCOPIC-ADD ON

## 2014-07-24 LAB — APTT: aPTT: 43 seconds — ABNORMAL HIGH (ref 24–37)

## 2014-07-24 NOTE — H&P (Signed)
Alexander Smith is an 73 y.o. male.   Chief Complaint: right shoulder pain, right greater than left HPI: The patient is a 73 year old male who presented with the chief complaint of bilateral shoulder pain. In regards to the left shoulder, he occasional has some some discomfort. This is not something that bothers him on a daily basis. No injury. No decreased strength. With range of motion of the right shoulder, he has had pain for the last six months. No injury that he can recall. He does recall doing a lot of splitting of wood in his yard a couple of days before the pain onset. At no time did he feel a pop or snap in his shoulder. He states it radiates down to the midhumeral area but not down the entire arm. He has noted decreased strength in range of motion in his right shoulder. MRI of the right shoulder revealed tear of the rotator cuff.   Past Medical History  Diagnosis Date  . Hypertension   . DVT (deep venous thrombosis)     left leg  . Prostate enlargement   . Chronic anticoagulation   . History of kidney stones   . Arthritis     Past Surgical History  Procedure Laterality Date  . Hernia repair    . Back surgery      lumbar  . Eye surgery Bilateral     cataract extraction with IOL    Social History:  reports that he has never smoked. He has never used smokeless tobacco. He reports that he does not drink alcohol or use illicit drugs.  Allergies:  Allergies  Allergen Reactions  . Aspirin Other (See Comments)    Due to Coumadin    Current outpatient prescriptions: acetaminophen (TYLENOL) 500 MG tablet, Take 1,000 mg by mouth 2 (two) times daily., Disp: , Rfl: ;   finasteride (PROSCAR) 5 MG tablet, Take 5 mg by mouth every morning., Disp: , Rfl: ;   metoprolol (LOPRESSOR) 50 MG tablet, Take 50 mg by mouth every morning., Disp: , Rfl:  warfarin (COUMADIN) 2.5 MG tablet, Take 2.5-5 mg by mouth See admin instructions. He takes everyday except for on Monday and Wednesday. He  takes two tablets on those two days., Disp: , Rfl:   Results for orders placed during the hospital encounter of 07/24/14 (from the past 48 hour(s))  URINALYSIS, ROUTINE W REFLEX MICROSCOPIC     Status: Abnormal   Collection Time    07/24/14 10:15 AM      Result Value Ref Range   Color, Urine YELLOW  YELLOW   APPearance CLEAR  CLEAR   Specific Gravity, Urine 1.025  1.005 - 1.030   pH 6.5  5.0 - 8.0   Glucose, UA NEGATIVE  NEGATIVE mg/dL   Hgb urine dipstick NEGATIVE  NEGATIVE   Bilirubin Urine NEGATIVE  NEGATIVE   Ketones, ur NEGATIVE  NEGATIVE mg/dL   Protein, ur NEGATIVE  NEGATIVE mg/dL   Urobilinogen, UA 0.2  0.0 - 1.0 mg/dL   Nitrite NEGATIVE  NEGATIVE   Leukocytes, UA SMALL (*) NEGATIVE  URINE MICROSCOPIC-ADD ON     Status: None   Collection Time    07/24/14 10:15 AM      Result Value Ref Range   Squamous Epithelial / LPF RARE  RARE   WBC, UA 0-2  <3 WBC/hpf   RBC / HPF 0-2  <3 RBC/hpf  APTT     Status: Abnormal   Collection Time    07/24/14  10:45 AM      Result Value Ref Range   aPTT 43 (*) 24 - 37 seconds   Comment:            IF BASELINE aPTT IS ELEVATED,     SUGGEST PATIENT RISK ASSESSMENT     BE USED TO DETERMINE APPROPRIATE     ANTICOAGULANT THERAPY.  PROTIME-INR     Status: Abnormal   Collection Time    07/24/14 10:45 AM      Result Value Ref Range   Prothrombin Time 18.5 (*) 11.6 - 15.2 seconds   INR 1.54 (*) 0.00 - 1.49   Review of Systems  Constitutional: Negative.   HENT: Negative for congestion, ear discharge, ear pain, hearing loss, nosebleeds, sore throat and tinnitus.   Eyes: Negative.   Respiratory: Negative.  Negative for stridor.   Cardiovascular: Negative.   Genitourinary: Positive for frequency. Negative for dysuria, urgency, hematuria and flank pain.  Musculoskeletal: Positive for back pain, joint pain, myalgias and neck pain. Negative for falls.       Bilateral shoulder pain  Skin: Negative.   Neurological: Negative.  Negative for  headaches.  Endo/Heme/Allergies: Negative.   Psychiatric/Behavioral: Negative.    Vitals Weight: 175 lb Height: 69in Body Surface Area: 1.97 m Body Mass Index: 25.84 kg/m Pulse: 54 (Regular)  BP: 154/75 (Sitting, Left Arm, Standard)  Physical Exam  Constitutional: He is oriented to person, place, and time. He appears well-developed and well-nourished. No distress.  HENT:  Head: Normocephalic and atraumatic.  Right Ear: External ear normal.  Left Ear: External ear normal.  Nose: Nose normal.  Mouth/Throat: Oropharynx is clear and moist.  Eyes: Conjunctivae and EOM are normal.  Neck: Normal range of motion. Neck supple.  Cardiovascular: Regular rhythm, normal heart sounds and intact distal pulses.  Bradycardia present.   No murmur heard. Respiratory: Effort normal and breath sounds normal. No respiratory distress. He has no wheezes.  GI: Soft. Bowel sounds are normal.  Musculoskeletal:       Right shoulder: He exhibits decreased range of motion, tenderness, pain and decreased strength.       Left shoulder: He exhibits pain. He exhibits normal range of motion and no tenderness.       Right elbow: Normal.      Left elbow: Normal.       Right wrist: Normal.       Left wrist: Normal.  The left shoulder shows a mild discomfort with passive and active range of motion. He is able to go through full internal and external rotation, abduction as well as overhead movement. Strength is 5/5 in the left upper extremity. No masses or tumors are palpated in the axillary spaces. Biceps and triceps are intact bilaterally. The right shoulder he does have a significant pain with passive and active range of motion, primarily with abduction and internal rotation. He is unable to fully abduct his arm. He can not internally rotate and bring his arm around his back. He does have a positive empty can test on the right.  Neurological: He is alert and oriented to person, place, and time. He has normal  reflexes. No sensory deficit.  Skin: No rash noted. He is not diaphoretic. No erythema.  Psychiatric: He has a normal mood and affect. His behavior is normal.     Assessment/Plan Right shoulder, rotator cuff tear He needs an open right shoulder rotator cuff repair with graft and anchors. We may or may not need to use a  graft material that is made from calf skin. This is approved by the FDA and I have not had a rejection of that graft yet. Also, we may need to use anchors. These are polyethylene anchors that stay in the bone and we use those anchors to suture the tendon down in certain cases where the tendon is completely pulled off the bone. There is always a chance of a secondary infection obviously with any surgery but we do use antibiotics preop.   Rebbeca Sheperd LAUREN 07/24/2014, 11:53 AM

## 2014-07-24 NOTE — Progress Notes (Signed)
Faxed urine with micro and chest x ray to Dr Darrelyn HillockGioffre via epic

## 2014-07-24 NOTE — Progress Notes (Signed)
[facility], I stated I needed to verify that chest xray was received and reviewed.  Per CHRIS, transferred to Imelda PillowWendy Caton, Dr Joellyn RuedGioffres scheduler and message left for provider to review this at [phone]

## 2014-07-24 NOTE — Progress Notes (Signed)
Per Imel[facility] PillowWendy Cat[facility] Dr Darrelyn HillockGioffre is aware of chest x ary and will address this

## 2014-07-26 ENCOUNTER — Encounter (HOSPITAL_COMMUNITY): Payer: Self-pay | Admitting: *Deleted

## 2014-07-26 ENCOUNTER — Encounter (HOSPITAL_COMMUNITY): Payer: Medicare HMO | Admitting: Certified Registered Nurse Anesthetist

## 2014-07-26 ENCOUNTER — Encounter (HOSPITAL_COMMUNITY)
Admission: RE | Disposition: A | Discharge status: Home / Self Care | Payer: Self-pay | Source: Ambulatory Visit | Attending: Orthopedic Surgery

## 2014-07-26 ENCOUNTER — Ambulatory Visit (HOSPITAL_COMMUNITY): Payer: Medicare HMO | Admitting: Certified Registered Nurse Anesthetist

## 2014-07-26 ENCOUNTER — Observation Stay (HOSPITAL_COMMUNITY)
Admission: RE | Admit: 2014-07-26 | Discharge: 2014-07-27 | Disposition: A | Discharge status: Home / Self Care | Payer: Medicare HMO | Source: Ambulatory Visit | Attending: Orthopedic Surgery | Admitting: Orthopedic Surgery

## 2014-07-26 DIAGNOSIS — M75100 Unspecified rotator cuff tear or rupture of unspecified shoulder, not specified as traumatic: Secondary | ICD-10-CM | POA: Diagnosis present

## 2014-07-26 DIAGNOSIS — Z7901 Long term (current) use of anticoagulants: Secondary | ICD-10-CM | POA: Insufficient documentation

## 2014-07-26 DIAGNOSIS — M719 Bursopathy, unspecified: Secondary | ICD-10-CM | POA: Insufficient documentation

## 2014-07-26 DIAGNOSIS — Z86718 Personal history of other venous thrombosis and embolism: Secondary | ICD-10-CM | POA: Diagnosis not present

## 2014-07-26 DIAGNOSIS — X58XXXA Exposure to other specified factors, initial encounter: Secondary | ICD-10-CM | POA: Insufficient documentation

## 2014-07-26 DIAGNOSIS — Z886 Allergy status to analgesic agent status: Secondary | ICD-10-CM | POA: Insufficient documentation

## 2014-07-26 DIAGNOSIS — N400 Enlarged prostate without lower urinary tract symptoms: Secondary | ICD-10-CM | POA: Insufficient documentation

## 2014-07-26 DIAGNOSIS — S43429A Sprain of unspecified rotator cuff capsule, initial encounter: Principal | ICD-10-CM | POA: Insufficient documentation

## 2014-07-26 DIAGNOSIS — Z9849 Cataract extraction status, unspecified eye: Secondary | ICD-10-CM | POA: Diagnosis not present

## 2014-07-26 DIAGNOSIS — M75120 Complete rotator cuff tear or rupture of unspecified shoulder, not specified as traumatic: Secondary | ICD-10-CM | POA: Diagnosis present

## 2014-07-26 DIAGNOSIS — M7580 Other shoulder lesions, unspecified shoulder: Secondary | ICD-10-CM

## 2014-07-26 DIAGNOSIS — M25819 Other specified joint disorders, unspecified shoulder: Secondary | ICD-10-CM | POA: Diagnosis not present

## 2014-07-26 DIAGNOSIS — M129 Arthropathy, unspecified: Secondary | ICD-10-CM | POA: Insufficient documentation

## 2014-07-26 DIAGNOSIS — M12819 Other specific arthropathies, not elsewhere classified, unspecified shoulder: Secondary | ICD-10-CM | POA: Diagnosis present

## 2014-07-26 DIAGNOSIS — M67919 Unspecified disorder of synovium and tendon, unspecified shoulder: Secondary | ICD-10-CM | POA: Diagnosis not present

## 2014-07-26 DIAGNOSIS — M75121 Complete rotator cuff tear or rupture of right shoulder, not specified as traumatic: Secondary | ICD-10-CM

## 2014-07-26 DIAGNOSIS — I10 Essential (primary) hypertension: Secondary | ICD-10-CM | POA: Diagnosis not present

## 2014-07-26 HISTORY — PX: SHOULDER OPEN ROTATOR CUFF REPAIR: SHX2407

## 2014-07-26 LAB — PROTIME-INR
INR: 1.14 (ref 0.00–1.49)
Prothrombin Time: 14.6 seconds (ref 11.6–15.2)

## 2014-07-26 SURGERY — REPAIR, ROTATOR CUFF, OPEN
Anesthesia: General | Site: Shoulder | Laterality: Right

## 2014-07-26 MED ORDER — FENTANYL CITRATE 0.05 MG/ML IJ SOLN
INTRAMUSCULAR | Status: AC
  Filled 2014-07-26: qty 5

## 2014-07-26 MED ORDER — ONDANSETRON HCL 4 MG/2ML IJ SOLN: 4.0000 mg | Freq: Four times a day (QID) | INTRAMUSCULAR | Status: DC | PRN

## 2014-07-26 MED ORDER — MENTHOL 3 MG MT LOZG
1.0000 | LOZENGE | OROMUCOSAL | Status: DC | PRN
Start: 2014-07-26 — End: 2014-07-27

## 2014-07-26 MED ORDER — BISACODYL 5 MG PO TBEC: 5.0000 mg | DELAYED_RELEASE_TABLET | Freq: Every day | ORAL | Status: DC | PRN

## 2014-07-26 MED ORDER — METHOCARBAMOL 1000 MG/10ML IJ SOLN
500.0000 mg | Freq: Four times a day (QID) | INTRAVENOUS | Status: DC | PRN
  Administered 2014-07-26: 500 mg via INTRAVENOUS
  Filled 2014-07-26: qty 5

## 2014-07-26 MED ORDER — MIDAZOLAM HCL 2 MG/2ML IJ SOLN
INTRAMUSCULAR | Status: AC
  Filled 2014-07-26: qty 2

## 2014-07-26 MED ORDER — ONDANSETRON HCL 4 MG PO TABS: 4.0000 mg | ORAL_TABLET | Freq: Four times a day (QID) | ORAL | Status: DC | PRN

## 2014-07-26 MED ORDER — LACTATED RINGERS IV SOLN
INTRAVENOUS | Status: DC
  Administered 2014-07-26: 16:00:00 via INTRAVENOUS
  Administered 2014-07-26: 1000 mL via INTRAVENOUS

## 2014-07-26 MED ORDER — CEFAZOLIN SODIUM-DEXTROSE 2-3 GM-% IV SOLR
INTRAVENOUS | Status: AC
  Filled 2014-07-26: qty 50

## 2014-07-26 MED ORDER — NEOSTIGMINE METHYLSULFATE 10 MG/10ML IV SOLN
INTRAVENOUS | Status: AC
  Filled 2014-07-26: qty 1

## 2014-07-26 MED ORDER — FENTANYL CITRATE 0.05 MG/ML IJ SOLN
INTRAMUSCULAR | Status: DC | PRN
Start: 2014-07-26 — End: 2014-07-26
  Administered 2014-07-26 (×2): 50 ug via INTRAVENOUS
  Administered 2014-07-26: 25 ug via INTRAVENOUS
  Administered 2014-07-26: 50 ug via INTRAVENOUS
  Administered 2014-07-26: 25 ug via INTRAVENOUS
  Administered 2014-07-26: 50 ug via INTRAVENOUS

## 2014-07-26 MED ORDER — HYDROMORPHONE HCL PF 1 MG/ML IJ SOLN: 0.5000 mg | INTRAMUSCULAR | Status: DC | PRN

## 2014-07-26 MED ORDER — PHENOL 1.4 % MT LIQD: 1.0000 | OROMUCOSAL | Status: DC | PRN

## 2014-07-26 MED ORDER — ATROPINE SULFATE 0.4 MG/ML IJ SOLN
INTRAMUSCULAR | Status: AC
  Filled 2014-07-26: qty 1

## 2014-07-26 MED ORDER — METHOCARBAMOL 500 MG PO TABS
500.0000 mg | ORAL_TABLET | Freq: Four times a day (QID) | ORAL | Status: DC | PRN
  Administered 2014-07-27 (×2): 500 mg via ORAL
  Filled 2014-07-26 (×2): qty 1

## 2014-07-26 MED ORDER — LACTATED RINGERS IV SOLN
INTRAVENOUS | Status: DC
  Administered 2014-07-26 – 2014-07-27 (×2): via INTRAVENOUS

## 2014-07-26 MED ORDER — FLEET ENEMA 7-19 GM/118ML RE ENEM: 1.0000 | ENEMA | Freq: Once | RECTAL | Status: AC | PRN

## 2014-07-26 MED ORDER — ROCURONIUM BROMIDE 100 MG/10ML IV SOLN
INTRAVENOUS | Status: AC
  Filled 2014-07-26: qty 1

## 2014-07-26 MED ORDER — LIDOCAINE HCL (CARDIAC) 20 MG/ML IV SOLN
INTRAVENOUS | Status: AC
  Filled 2014-07-26: qty 5

## 2014-07-26 MED ORDER — PROPOFOL 10 MG/ML IV BOLUS
INTRAVENOUS | Status: AC
  Filled 2014-07-26: qty 20

## 2014-07-26 MED ORDER — PROMETHAZINE HCL 25 MG/ML IJ SOLN: 6.2500 mg | INTRAMUSCULAR | Status: DC | PRN

## 2014-07-26 MED ORDER — HYDROCODONE-ACETAMINOPHEN 5-325 MG PO TABS
1.0000 | ORAL_TABLET | ORAL | Status: DC | PRN
  Administered 2014-07-26: 1 via ORAL
  Administered 2014-07-27 (×2): 2 via ORAL
  Filled 2014-07-26: qty 1
  Filled 2014-07-26 (×2): qty 2

## 2014-07-26 MED ORDER — POLYETHYLENE GLYCOL 3350 17 G PO PACK: 17.0000 g | PACK | Freq: Every day | ORAL | Status: DC | PRN

## 2014-07-26 MED ORDER — DEXAMETHASONE SODIUM PHOSPHATE 10 MG/ML IJ SOLN
INTRAMUSCULAR | Status: DC | PRN
  Administered 2014-07-26: 10 mg via INTRAVENOUS

## 2014-07-26 MED ORDER — METOPROLOL TARTRATE 50 MG PO TABS
50.0000 mg | ORAL_TABLET | Freq: Every morning | ORAL | Status: DC
  Administered 2014-07-27: 50 mg via ORAL
  Filled 2014-07-26: qty 1

## 2014-07-26 MED ORDER — ONDANSETRON HCL 4 MG/2ML IJ SOLN
INTRAMUSCULAR | Status: AC
  Filled 2014-07-26: qty 2

## 2014-07-26 MED ORDER — MEPERIDINE HCL 50 MG/ML IJ SOLN: 6.2500 mg | INTRAMUSCULAR | Status: DC | PRN

## 2014-07-26 MED ORDER — THROMBIN 5000 UNITS EX SOLR
CUTANEOUS | Status: DC | PRN
  Administered 2014-07-26: 5000 [IU] via TOPICAL

## 2014-07-26 MED ORDER — BUPIVACAINE LIPOSOME 1.3 % IJ SUSP
20.0000 mL | Freq: Once | INTRAMUSCULAR | Status: AC
  Administered 2014-07-26: 20 mL
  Filled 2014-07-26: qty 20

## 2014-07-26 MED ORDER — GLYCOPYRROLATE 0.2 MG/ML IJ SOLN
INTRAMUSCULAR | Status: AC
  Filled 2014-07-26: qty 3

## 2014-07-26 MED ORDER — FINASTERIDE 5 MG PO TABS
5.0000 mg | ORAL_TABLET | Freq: Every morning | ORAL | Status: DC
  Administered 2014-07-27: 5 mg via ORAL
  Filled 2014-07-26: qty 1

## 2014-07-26 MED ORDER — LIDOCAINE HCL (CARDIAC) 20 MG/ML IV SOLN
INTRAVENOUS | Status: DC | PRN
  Administered 2014-07-26: 60 mg via INTRAVENOUS

## 2014-07-26 MED ORDER — CEFAZOLIN SODIUM 1-5 GM-% IV SOLN
1.0000 g | Freq: Four times a day (QID) | INTRAVENOUS | Status: AC
  Administered 2014-07-26 – 2014-07-27 (×3): 1 g via INTRAVENOUS
  Filled 2014-07-26 (×3): qty 50

## 2014-07-26 MED ORDER — EPHEDRINE SULFATE 50 MG/ML IJ SOLN
INTRAMUSCULAR | Status: DC | PRN
  Administered 2014-07-26: 5 mg via INTRAVENOUS

## 2014-07-26 MED ORDER — CISATRACURIUM BESYLATE (PF) 10 MG/5ML IV SOLN
INTRAVENOUS | Status: DC | PRN
  Administered 2014-07-26: 6 mg via INTRAVENOUS

## 2014-07-26 MED ORDER — ACETAMINOPHEN 650 MG RE SUPP: 650.0000 mg | Freq: Four times a day (QID) | RECTAL | Status: DC | PRN

## 2014-07-26 MED ORDER — PHENYLEPHRINE HCL 10 MG/ML IJ SOLN
INTRAMUSCULAR | Status: AC
  Filled 2014-07-26: qty 1

## 2014-07-26 MED ORDER — PHENYLEPHRINE HCL 10 MG/ML IJ SOLN
10.0000 mg | INTRAMUSCULAR | Status: DC | PRN
  Administered 2014-07-26: 40 ug/min via INTRAVENOUS

## 2014-07-26 MED ORDER — ONDANSETRON HCL 4 MG/2ML IJ SOLN
INTRAMUSCULAR | Status: DC | PRN
  Administered 2014-07-26 (×2): 2 mg via INTRAVENOUS

## 2014-07-26 MED ORDER — ACETAMINOPHEN 10 MG/ML IV SOLN
1000.0000 mg | Freq: Once | INTRAVENOUS | Status: AC
  Administered 2014-07-26: 1000 mg via INTRAVENOUS
  Filled 2014-07-26: qty 100

## 2014-07-26 MED ORDER — MIDAZOLAM HCL 5 MG/5ML IJ SOLN
INTRAMUSCULAR | Status: DC | PRN
  Administered 2014-07-26 (×2): .5 mg via INTRAVENOUS

## 2014-07-26 MED ORDER — PROPOFOL 10 MG/ML IV BOLUS
INTRAVENOUS | Status: DC | PRN
  Administered 2014-07-26: 150 mg via INTRAVENOUS

## 2014-07-26 MED ORDER — CEFAZOLIN SODIUM-DEXTROSE 2-3 GM-% IV SOLR
2.0000 g | INTRAVENOUS | Status: AC
  Administered 2014-07-26: 2 g via INTRAVENOUS

## 2014-07-26 MED ORDER — SUCCINYLCHOLINE CHLORIDE 20 MG/ML IJ SOLN
INTRAMUSCULAR | Status: DC | PRN
  Administered 2014-07-26: 100 mg via INTRAVENOUS

## 2014-07-26 MED ORDER — THROMBIN 5000 UNITS EX SOLR
CUTANEOUS | Status: AC
  Filled 2014-07-26: qty 5000

## 2014-07-26 MED ORDER — SODIUM CHLORIDE 0.9 % IJ SOLN
INTRAMUSCULAR | Status: AC
Start: 2014-07-26 — End: 2014-07-26
  Filled 2014-07-26: qty 10

## 2014-07-26 MED ORDER — EPHEDRINE SULFATE 50 MG/ML IJ SOLN
INTRAMUSCULAR | Status: AC
  Filled 2014-07-26: qty 1

## 2014-07-26 MED ORDER — FENTANYL CITRATE 0.05 MG/ML IJ SOLN
25.0000 ug | INTRAMUSCULAR | Status: DC | PRN
  Administered 2014-07-26 (×2): 50 ug via INTRAVENOUS

## 2014-07-26 MED ORDER — GLYCOPYRROLATE 0.2 MG/ML IJ SOLN
INTRAMUSCULAR | Status: DC | PRN
  Administered 2014-07-26 (×2): .1 mg via INTRAVENOUS

## 2014-07-26 MED ORDER — ACETAMINOPHEN 325 MG PO TABS: 650.0000 mg | ORAL_TABLET | Freq: Four times a day (QID) | ORAL | Status: DC | PRN

## 2014-07-26 MED ORDER — SODIUM CHLORIDE 0.9 % IR SOLN
Status: DC | PRN
  Administered 2014-07-26: 16:00:00

## 2014-07-26 MED ORDER — DEXAMETHASONE SODIUM PHOSPHATE 10 MG/ML IJ SOLN
INTRAMUSCULAR | Status: AC
  Filled 2014-07-26: qty 1

## 2014-07-26 MED ORDER — OXYCODONE-ACETAMINOPHEN 5-325 MG PO TABS: 1.0000 | ORAL_TABLET | ORAL | Status: DC | PRN

## 2014-07-26 MED ORDER — DOCUSATE SODIUM 100 MG PO CAPS
100.0000 mg | ORAL_CAPSULE | Freq: Two times a day (BID) | ORAL | Status: DC
  Administered 2014-07-26 – 2014-07-27 (×2): 100 mg via ORAL

## 2014-07-26 MED ORDER — FENTANYL CITRATE 0.05 MG/ML IJ SOLN
INTRAMUSCULAR | Status: AC
  Filled 2014-07-26: qty 2

## 2014-07-26 SURGICAL SUPPLY — 45 items
BAG ZIPLOCK 12X15 (MISCELLANEOUS) IMPLANT
BLADE OSCILLATING/SAGITTAL (BLADE) ×1
BLADE SW THK.38XMED LNG THN (BLADE) ×1 IMPLANT
BNDG COHESIVE 6X5 TAN NS LF (GAUZE/BANDAGES/DRESSINGS) ×2 IMPLANT
BUR OVAL CARBIDE 4.0 (BURR) ×2 IMPLANT
CLEANER TIP ELECTROSURG 2X2 (MISCELLANEOUS) ×2 IMPLANT
DERMABOND ADVANCED (GAUZE/BANDAGES/DRESSINGS) ×1
DERMABOND ADVANCED .7 DNX12 (GAUZE/BANDAGES/DRESSINGS) ×1 IMPLANT
DRAPE POUCH INSTRU U-SHP 10X18 (DRAPES) ×2 IMPLANT
DRSG AQUACEL AG ADV 3.5X 6 (GAUZE/BANDAGES/DRESSINGS) ×2 IMPLANT
DRSG EMULSION OIL 3X3 NADH (GAUZE/BANDAGES/DRESSINGS) ×2 IMPLANT
DURAPREP 26ML APPLICATOR (WOUND CARE) ×2 IMPLANT
ELECT REM PT RETURN 9FT ADLT (ELECTROSURGICAL) ×2
ELECTRODE REM PT RTRN 9FT ADLT (ELECTROSURGICAL) ×1 IMPLANT
GLOVE BIOGEL PI IND STRL 6.5 (GLOVE) ×1 IMPLANT
GLOVE BIOGEL PI IND STRL 8 (GLOVE) ×1 IMPLANT
GLOVE BIOGEL PI INDICATOR 6.5 (GLOVE) ×1
GLOVE BIOGEL PI INDICATOR 8 (GLOVE) ×1
GLOVE ECLIPSE 8.0 STRL XLNG CF (GLOVE) ×2 IMPLANT
GLOVE SURG SS PI 6.5 STRL IVOR (GLOVE) ×2 IMPLANT
GOWN STRL REUS W/TWL LRG LVL3 (GOWN DISPOSABLE) ×2 IMPLANT
GOWN STRL REUS W/TWL XL LVL3 (GOWN DISPOSABLE) ×2 IMPLANT
KIT BASIN OR (CUSTOM PROCEDURE TRAY) ×2 IMPLANT
KIT POSITION SHOULDER SCHLEI (MISCELLANEOUS) ×2 IMPLANT
MANIFOLD NEPTUNE II (INSTRUMENTS) ×2 IMPLANT
NEEDLE MA TROC 1/2 (NEEDLE) IMPLANT
NS IRRIG 1000ML POUR BTL (IV SOLUTION) IMPLANT
PACK SHOULDER CUSTOM OPM052 (CUSTOM PROCEDURE TRAY) ×2 IMPLANT
PAD ABD 8X10 STRL (GAUZE/BANDAGES/DRESSINGS) ×2 IMPLANT
POSITIONER SURGICAL ARM (MISCELLANEOUS) ×2 IMPLANT
SLING ARM IMMOBILIZER LRG (SOFTGOODS) ×2 IMPLANT
SPONGE SURGIFOAM ABS GEL 100 (HEMOSTASIS) ×2 IMPLANT
STAPLER VISISTAT 35W (STAPLE) IMPLANT
SUCTION FRAZIER 12FR DISP (SUCTIONS) ×2 IMPLANT
SUT BONE WAX W31G (SUTURE) ×2 IMPLANT
SUT ETHIBOND NAB CT1 #1 30IN (SUTURE) ×2 IMPLANT
SUT MNCRL AB 4-0 PS2 18 (SUTURE) ×2 IMPLANT
SUT VIC AB 0 CT1 27 (SUTURE)
SUT VIC AB 0 CT1 27XBRD ANTBC (SUTURE) IMPLANT
SUT VIC AB 1 CT1 27 (SUTURE) ×2
SUT VIC AB 1 CT1 27XBRD ANTBC (SUTURE) ×2 IMPLANT
SUT VIC AB 2-0 CT1 27 (SUTURE) ×2
SUT VIC AB 2-0 CT1 27XBRD (SUTURE) ×2 IMPLANT
TAPE CLOTH SURG 4X10 WHT LF (GAUZE/BANDAGES/DRESSINGS) ×2 IMPLANT
TOWEL OR 17X26 10 PK STRL BLUE (TOWEL DISPOSABLE) ×2 IMPLANT

## 2014-07-26 NOTE — Anesthesia Postprocedure Evaluation (Signed)
  Anesthesia Post-op Note  Patient: Alexander Smith  Procedure(s) Performed: Procedure(s) (LRB): RIGHT ROTATOR CUFF REPAIR SHOULDER OPEN  (Right)  Patient Location: PACU  Anesthesia Type: General  Level of Consciousness: awake and alert   Airway and Oxygen Therapy: Patient Spontanous Breathing  Post-op Pain: mild  Post-op Assessment: Post-op Vital signs reviewed, Patient's Cardiovascular Status Stable, Respiratory Function Stable, Patent Airway and No signs of Nausea or vomiting  Last Vitals:  Filed Vitals:   07/26/14 1741  BP: 161/72  Pulse: 67  Temp: 36.7 C  Resp: 15    Post-op Vital Signs: stable   Complications: No apparent anesthesia complications

## 2014-07-26 NOTE — Transfer of Care (Signed)
Immediate Anesthesia Transfer of Care Note  Patient: Alexander Smith  Procedure(s) Performed: Procedure(s): RIGHT ROTATOR CUFF REPAIR SHOULDER OPEN  (Right)  Patient Location: PACU  Anesthesia Type:General  Level of Consciousness: awake, alert , oriented and patient cooperative  Airway & Oxygen Therapy: Patient Spontanous Breathing and Patient connected to face mask oxygen  Post-op Assessment: Report given to PACU RN, Post -op Vital signs reviewed and stable and Patient moving all extremities  Post vital signs: Reviewed and stable  Complications: No apparent anesthesia complications

## 2014-07-26 NOTE — Anesthesia Preprocedure Evaluation (Signed)
Anesthesia Evaluation  Patient identified by MRN, date of birth, ID band Patient awake    Reviewed: Allergy & Precautions, H&P , NPO status , Patient's Chart, lab work & pertinent test results  Airway Mallampati: II TM Distance: >3 FB Neck ROM: Full    Dental no notable dental hx. (+) Upper Dentures   Pulmonary neg pulmonary ROS,  breath sounds clear to auscultation  Pulmonary exam normal       Cardiovascular hypertension, Pt. on medications Rhythm:Regular Rate:Normal  LBBB   Neuro/Psych negative neurological ROS  negative psych ROS   GI/Hepatic negative GI ROS, Neg liver ROS,   Endo/Other  negative endocrine ROS  Renal/GU negative Renal ROS  negative genitourinary   Musculoskeletal negative musculoskeletal ROS (+)   Abdominal   Peds negative pediatric ROS (+)  Hematology negative hematology ROS (+)   Anesthesia Other Findings   Reproductive/Obstetrics negative OB ROS                           Anesthesia Physical Anesthesia Plan  ASA: II  Anesthesia Plan: General   Post-op Pain Management:    Induction: Intravenous  Airway Management Planned: Oral ETT  Additional Equipment:   Intra-op Plan:   Post-operative Plan: Extubation in OR  Informed Consent: I have reviewed the patients History and Physical, chart, labs and discussed the procedure including the risks, benefits and alternatives for the proposed anesthesia with the patient or authorized representative who has indicated his/her understanding and acceptance.   Dental advisory given  Plan Discussed with: CRNA  Anesthesia Plan Comments:         Anesthesia Quick Evaluation

## 2014-07-26 NOTE — Brief Op Note (Signed)
07/26/2014  4:10 PM  PATIENT:  Alexander Smith  73 y.o. male  PRE-OPERATIVE DIAGNOSIS:  right shoulder rotator cuff tear.with severe Impingement.  POST-OPERATIVE DIAGNOSIS:  right shoulder rotator cuff repair with severe Impingement  PROCEDURE:  Procedure(s): RIGHT ROTATOR CUFF REPAIR SHOULDER OPEN  (Right),Complex with open Acromionectomy  SURGEON:  Surgeon(s) and Role:    * Jacki Conesonald A Kavion Mancinas, MD - Primary  PHYSICIAN ASSISTANT: Dimitri PedAmber Constable PA  ASSISTANTS: Dimitri PedAmber Constable PA  ANESTHESIA:   general  EBL:  Total I/O In: 1000 [I.V.:1000] Out: -   BLOOD ADMINISTERED:none  DRAINS: none   LOCAL MEDICATIONS USED:  BUPIVICAINE 20cc  SPECIMEN:  No Specimen  DISPOSITION OF SPECIMEN:  N/A  COUNTS:  YES  TOURNIQUET:  * No tourniquets in log *  DICTATION: .Other Dictation: Dictation Number [id number]  PLAN OF CARE: Admit for overnight observation  PATIENT DISPOSITION:  Stable in Surgery   Delay start of Pharmacological VTE agent (>24hrs) due to surgical blood loss or risk of bleeding: yes

## 2014-07-26 NOTE — Interval H&P Note (Signed)
History and Physical Interval Note:  07/26/2014 2:32 PM  Alexander Smith  has presented today for surgery, with the diagnosis of right shoulder rotator cuff tear  The various methods of treatment have been discussed with the patient and family. After consideration of risks, benefits and other options for treatment, the patient has consented to  Procedure(s): RIGHT ROTATOR CUFF REPAIR SHOULDER OPEN WITH POSSIBLE GRAFT AND ANCHORS (Right) as a surgical intervention .  The patient's history has been reviewed, patient examined, no change in status, stable for surgery.  I have reviewed the patient's chart and labs.  Questions were answered to the patient's satisfaction.     [name] A

## 2014-07-27 ENCOUNTER — Encounter (HOSPITAL_COMMUNITY): Payer: Self-pay | Admitting: Orthopedic Surgery

## 2014-07-27 DIAGNOSIS — M75100 Unspecified rotator cuff tear or rupture of unspecified shoulder, not specified as traumatic: Secondary | ICD-10-CM | POA: Diagnosis present

## 2014-07-27 DIAGNOSIS — S43429A Sprain of unspecified rotator cuff capsule, initial encounter: Secondary | ICD-10-CM | POA: Diagnosis not present

## 2014-07-27 DIAGNOSIS — M12819 Other specific arthropathies, not elsewhere classified, unspecified shoulder: Secondary | ICD-10-CM | POA: Diagnosis present

## 2014-07-27 MED ORDER — HYDROCODONE-ACETAMINOPHEN 5-325 MG PO TABS: 1.0000 | ORAL_TABLET | ORAL | Status: DC | PRN

## 2014-07-27 MED ORDER — METHOCARBAMOL 500 MG PO TABS: 500.0000 mg | ORAL_TABLET | Freq: Four times a day (QID) | ORAL | Status: DC | PRN

## 2014-07-27 NOTE — Progress Notes (Signed)
   Subjective: 1 Day Post-Op Procedure(s) (LRB): RIGHT ROTATOR CUFF REPAIR SHOULDER OPEN  (Right) Patient reports pain as mild.   Patient seen in rounds without Dr. Darrelyn HillockGioffre. Patient is well, and has had no acute complaints or problems. No issues overnight although he did not sleep well due to multiple interruptions. No SOB or chest pain. Reports that he is feeling well this morning.  Plan is to go Home after hospital stay.  Objective: Vital signs in last 24 hours: Temp:  [97.2 F (36.2 C)-99.2 F (37.3 C)] 97.7 F (36.5 C) (08/14 0609) Pulse Rate:  [58-82] 80 (08/14 0609) Resp:  [12-18] 16 (08/14 0609) BP: (138-169)/(65-86) 162/81 mmHg (08/14 0609) SpO2:  [97 %-100 %] 97 % (08/14 0609) Weight:  [80.287 kg (177 lb)] 80.287 kg (177 lb) (08/13 1308)  Intake/Output from previous day:  Intake/Output Summary (Last 24 hours) at 07/27/14 0733 Last data filed at 07/27/14 0650  Gross per 24 hour  Intake   4015 ml  Output    600 ml  Net   3415 ml     Recent Labs  07/24/14 1045 07/26/14 1310  INR 1.54* 1.14    EXAM General - Patient is Alert and Oriented Extremity - Neurologically intact Intact pulses distally Dressing/Incision - clean, dry, no drainage Motor Function - intact, moving hands and fingers well on exam  Past Medical History  Diagnosis Date  . Hypertension   . DVT (deep venous thrombosis)     left leg  . Prostate enlargement   . Chronic anticoagulation   . History of kidney stones   . Arthritis     Assessment/Plan: 1 Day Post-Op Procedure(s) (LRB): RIGHT ROTATOR CUFF REPAIR SHOULDER OPEN  (Right) Active Problems:   Complete tear of right rotator cuff   Complete rotator cuff tear   Rotator cuff tear arthropathy  Estimated body mass index is 26.13 kg/(m^2) as calculated from the following:   Height as of this encounter: 5\' 9"  (1.753 m).   Weight as of this encounter: 80.287 kg (177 lb). Advance diet Up with therapy D/C IV fluids  DVT Prophylaxis -  Coumadin   Must wear sling at all times. He is doing great. DC home after OT. Discharge instructions given.   Alexander PedAmber Rebekah Zackery, PA-C Orthopaedic Surgery 07/27/2014, 7:33 AM

## 2014-07-27 NOTE — Progress Notes (Addendum)
Patient verbalized understanding of discharge instructions. Patient was given prescriptions and discharge forms. Patient is stable at discharge. Patient is accompanied by nursing staff to lobby. Patient's family is in lobby waiting for patient.

## 2014-07-27 NOTE — Evaluation (Signed)
Occupational Therapy Evaluation Patient Details Name: Alexander Smith MRN: [id number] DOB: 11-18-1941 Today's Date: 07/27/2014    History of Present Illness Pt was admitted for R RCR   Clinical Impression   This 73 year old man was admitted for the above surgery. All education was completed.  Pt will follow up with MD for further rehab.      Follow Up Recommendations   (follow up with Dr Darrelyn HillockGioffre)    Equipment Recommendations  None recommended by OT    Recommendations for Other Services       Precautions / Restrictions Precautions Precautions: Shoulder Type of Shoulder Precautions: sling at all times x bathing/dressing Restrictions Weight Bearing Restrictions: Yes Other Position/Activity Restrictions: NWB      Mobility Bed Mobility Overal bed mobility: Modified Independent             General bed mobility comments: HOB raised  Transfers Overall transfer level: Modified independent Equipment used: None                  Balance                                            ADL Overall ADL's : Needs assistance/impaired     Grooming: Set up;Sitting   Upper Body Bathing: Moderate assistance;Sitting   Lower Body Bathing: Sit to/from stand;Moderate assistance   Upper Body Dressing : Maximal assistance;Sitting   Lower Body Dressing: Maximal assistance;Sit to/from stand   Toilet Transfer: Min guard;Stand-pivot (for safety, first time OOB since sx)   Toileting- ArchitectClothing Manipulation and Hygiene: Minimal assistance;Sit to/from stand         General ADL Comments: educated on shoulder protocol and reapplied sling.  Daughter assisted. She said she is familiar with sling from when she assisted her mother.  Pt plans to wear large button down shirt over sling:  educated that he could put in under, but he prefers over.  See education section of chart:  pt/daughter verbalize all education.  Pt also has bad R hip which made ADLs difficult.   Educated on use of reacher for pants.  Daughter will be with him when he showers.     Vision                     Perception     Praxis      Pertinent Vitals/Pain Pain Assessment: 0-10 Pain Score: 3  Pain Location: R shoulder Pain Descriptors / Indicators: Sore Pain Intervention(s): Repositioned;Ice applied     Hand Dominance Right   Extremity/Trunk Assessment Upper Extremity Assessment Upper Extremity Assessment: RUE deficits/detail;LUE deficits/detail RUE Deficits / Details: immobiliized LUE Deficits / Details: has difficulty lifting above 90; painful           Communication Communication Communication: No difficulties   Cognition Arousal/Alertness: Awake/alert Behavior During Therapy: WFL for tasks assessed/performed Overall Cognitive Status: Within Functional Limits for tasks assessed                     General Comments       Exercises       Shoulder Instructions      Home Living Family/patient expects to be discharged to:: Private residence Living Arrangements: Alone Available Help at Discharge: Family               Bathroom Shower/Tub: Tub/shower unit  Bathroom Toilet: Standard         Additional Comments: daughter will be in and out.  She thinks she can find him a shower seat to use      Prior Functioning/Environment Level of Independence: Independent             OT Diagnosis:     OT Problem List:     OT Treatment/Interventions:      OT Goals(Current goals can be found in the care plan section)    OT Frequency:     Barriers to D/C:            Co-evaluation              End of Session    Activity Tolerance: Patient tolerated treatment well Patient left: in chair;with call bell/phone within reach;with family/visitor present   Time: 1308-6578 OT Time Calculation (min): 28 min Charges:  OT General Charges $OT Visit: 1 Procedure OT Evaluation $Initial OT Evaluation Tier I: 1 Procedure OT  Treatments $Self Care/Home Management : 8-22 mins G-Codes: OT G-codes **NOT FOR INPATIENT CLASS** Functional Assessment Tool Used: clinical judgment and observation Functional Limitation: Self care Self Care Current Status (I6962): At least 60 percent but less than 80 percent impaired, limited or restricted Self Care Goal Status (X5284): At least 60 percent but less than 80 percent impaired, limited or restricted Self Care Discharge Status [phone]): At least 60 percent but less than 80 percent impaired, limited or restricted  [name] 07/27/2014, 9:08 AM  [name], OTR/L [phone] 07/27/2014

## 2014-07-27 NOTE — Op Note (Signed)
NAMWindy Kalata:  Steinhilber, Dyllen            ACCOUNT NO.:  [id number]  MEDICAL RECORD NO.:  [id number]  LOCATION:  1603                         [facility]  PHYSICIAN:  Georges Lynchonald A. Clarity Ciszek, M.D.DATE OF BIRTH:  1941-12-06  DATE OF PROCEDURE:  07/26/2014 DATE OF DISCHARGE:                              OPERATIVE REPORT   SURGEON:  Georges Lynchonald A. Darrelyn HillockGioffre, M.D.  ASSISTANT:  Dimitri PedAmber Constable, [place]  PREOPERATIVE DIAGNOSIS: 1. Complex tear of rotator cuff tendon, right shoulder. 2. Severe impingement, right shoulder.  POSTOPERATIVE DIAGNOSIS: 1. Complex tear of rotator cuff tendon, right shoulder. 2. Severe impingement, right shoulder.  OPERATION: 1. Open acromionectomy and acromioplasty, right shoulder.  Note, he     had an extremely large acromion. 2. Repair of rotator cuff tendon tear right shoulder. 3. Subdeltoid bursectomy.  DESCRIPTION OF PROCEDURE:  Under general anesthesia, routine orthopedic prep and draping, the right shoulder was carried out with the patient in a beach chair position.  The appropriate time-out was first carried out. I also marked the appropriate right shoulder in holding area.  He had 2 g of IV Ancef.  At this time, the incision was made over the anterior aspect of the right shoulder.  Bleeders were identified and cauterized. I then went down and dissected by sharp dissection the deltoid tendon from the acromion and also split the proximal part of the deltoid muscle.  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  I then protected the underlying cup with the Bennett retractor and did an acromionectomy and acromioplasty utilizing oscillating saw and a burr.  Note, once again, the acromion was severely thickened.  He had severe impingement syndrome.  After this, I completed the bursectomy and identified the tear and repaired this primarily and the rotator cuff. We thoroughly irrigated out the area and then inserted some  thrombin- soaked Gelfoam.  I bone waxed the undersurface of the acromion and we then closed the muscle and tendon in usual fashion.  We injected 20 mL of Exparel and the soft tissue.  Remaining part of the wound was closed with a running subcuticular Monocryl suture.  Sterile dressings were applied.  The patient did have 2 g of IV Ancef.  The patient then was placed in a shoulder immobilizer.          ______________________________ Georges Lynchonald A. Darrelyn HillockGioffre, M.D.     RAG/MEDQ  D:  07/26/2014  T:  07/27/2014  Job:  [id number]

## 2014-07-27 NOTE — Discharge Instructions (Signed)
Keep your sling on at all times, including sleeping in your sling. The only time you should remove your sling is to shower only but you need to keep your hand against your chest while you shower.  Do not change your dressing  Call Dr. Darrelyn HillockGioffre if any wound complications or temperature of 101 degrees F or over.  Call the office for an appointment to see Dr. Darrelyn HillockGioffre in two weeks: [phone] and ask for Dr. Jeannetta EllisGioffre's nurse, Mackey Birchwoodammy Johnson.

## 2014-08-01 ENCOUNTER — Other Ambulatory Visit (HOSPITAL_COMMUNITY): Payer: Self-pay | Admitting: Orthopedic Surgery

## 2014-08-01 DIAGNOSIS — R9389 Abnormal findings on diagnostic imaging of other specified body structures: Secondary | ICD-10-CM

## 2014-08-01 NOTE — Discharge Summary (Signed)
Physician Discharge Summary   Patient ID: Tyton Abdallah MRN: [id number] DOB/AGE: Mar 23, 1941 73 y.o.  Admit date: 07/26/2014 Discharge date: 07/27/2014  Primary Diagnosis: right shoulder, rotator cuff tear   Admission Diagnoses:  Past Medical History  Diagnosis Date  . Hypertension   . DVT (deep venous thrombosis)     left leg  . Prostate enlargement   . Chronic anticoagulation   . History of kidney stones   . Arthritis    Discharge Diagnoses:   Active Problems:   Complete tear of right rotator cuff   Complete rotator cuff tear   Rotator cuff tear arthropathy  Estimated body mass index is 26.13 kg/(m^2) as calculated from the following:   Height as of this encounter: 5\' 9"  (1.753 m).   Weight as of this encounter: 80.287 kg (177 lb).  Procedure:  Procedure(s) (LRB): RIGHT ROTATOR CUFF REPAIR SHOULDER OPEN  (Right)   Consults: None  HPI: The patient is a 73 year old male who presented with the chief complaint of bilateral shoulder pain. In regards to the left shoulder, he occasional has some some discomfort. This is not something that bothers him on a daily basis. No injury. No decreased strength. With range of motion of the right shoulder, he has had pain for the last six months. No injury that he can recall. He does recall doing a lot of splitting of wood in his yard a couple of days before the pain onset. At no time did he feel a pop or snap in his shoulder. He states it radiates down to the midhumeral area but not down the entire arm. He has noted decreased strength in range of motion in his right shoulder. MRI of the right shoulder revealed tear of the rotator cuff.   Laboratory Data: Admission on 07/26/2014, Discharged on 07/27/2014  Component Date Value Ref Range Status  . Prothrombin Time 07/26/2014 14.6  11.6 - 15.2 seconds Final  . INR 07/26/2014 1.14  0.00 - 1.49 Final  Hospital Outpatient Visit on 07/24/2014  Component Date Value Ref Range Status  . aPTT  07/24/2014 43* 24 - 37 seconds Final   Comment:                                 IF BASELINE aPTT IS ELEVATED,                          SUGGEST PATIENT RISK ASSESSMENT                          BE USED TO DETERMINE APPROPRIATE                          ANTICOAGULANT THERAPY.  . Prothrombin Time 07/24/2014 18.5* 11.6 - 15.2 seconds Final  . INR 07/24/2014 1.54* 0.00 - 1.49 Final  . Color, Urine 07/24/2014 YELLOW  YELLOW Final  . APPearance 07/24/2014 CLEAR  CLEAR Final  . Specific Gravity, Urine 07/24/2014 1.025  1.005 - 1.030 Final  . pH 07/24/2014 6.5  5.0 - 8.0 Final  . Glucose, UA 07/24/2014 NEGATIVE  NEGATIVE mg/dL Final  . Hgb urine dipstick 07/24/2014 NEGATIVE  NEGATIVE Final  . Bilirubin Urine 07/24/2014 NEGATIVE  NEGATIVE Final  . Ketones, ur 07/24/2014 NEGATIVE  NEGATIVE mg/dL Final  . Protein, ur 95/62/1308 NEGATIVE  NEGATIVE mg/dL Final  .  Urobilinogen, UA 07/24/2014 0.2  0.0 - 1.0 mg/dL Final  . Nitrite 40/98/1191 NEGATIVE  NEGATIVE Final  . Leukocytes, UA 07/24/2014 SMALL* NEGATIVE Final  . Squamous Epithelial / LPF 07/24/2014 RARE  RARE Final  . WBC, UA 07/24/2014 0-2  <3 WBC/hpf Final  . RBC / HPF 07/24/2014 0-2  <3 RBC/hpf Final     X-Rays:Dg Chest 2 View  07/24/2014   CLINICAL DATA:  Preoperative evaluation.  EXAM: CHEST  2 VIEW  COMPARISON:  Chest CT 04/15/2004  FINDINGS: Normal cardiac and mediastinal contours with mild tortuosity of the thoracic aorta. No consolidative pulmonary opacities. There is suggestion of a 9 mm nodule within the peripheral left lower lobe. No pleural effusion or pneumothorax. Mid thoracic spine degenerative change.  IMPRESSION: Suggestion of a 9 mm nodule within the peripheral left lower lobe. Recommend dedicated evaluation with chest CT.  No acute cardiopulmonary process.  These results will be called to the ordering clinician or representative by the Radiologist Assistant, and communication documented in the PACS or zVision Dashboard.    Electronically Signed   By: Annia Belt M.D.   On: 07/24/2014 13:22    EKG: Orders placed during the hospital encounter of 07/24/14  . EKG 12-LEAD  . EKG 12-LEAD  . EKG 12-LEAD  . EKG 12-LEAD     Hospital Course: Arsenio Woelfel is a 73 y.o. who was admitted to [facility]. They were brought to the operating room on 07/26/2014 and underwent Procedure(s): RIGHT ROTATOR CUFF REPAIR SHOULDER OPEN .  Patient tolerated the procedure well and was later transferred to the recovery room and then to the orthopaedic floor for postoperative care.  They were given PO and IV analgesics for pain control following their surgery.  They were given 24 hours of postoperative antibiotics of  Anti-infectives   Start     Dose/Rate Route Frequency Ordered Stop   07/26/14 2100  ceFAZolin (ANCEF) IVPB 1 g/50 mL premix     1 g 100 mL/hr over 30 Minutes Intravenous Every 6 hours 07/26/14 1745 07/27/14 0849   07/26/14 1608  polymyxin B 500,000 Units, bacitracin 50,000 Units in sodium chloride irrigation 0.9 % 500 mL irrigation  Status:  Discontinued       As needed 07/26/14 1608 07/26/14 1634   07/26/14 1300  ceFAZolin (ANCEF) IVPB 2 g/50 mL premix     2 g 100 mL/hr over 30 Minutes Intravenous On call to O.R. 07/26/14 1245 07/26/14 1445     and started on DVT prophylaxis in the form of Coumadin.   PT and OT were ordered.  Discharge planning consulted to help with postop disposition and equipment needs.  Patient had a good night on the evening of surgery.  They started to get up OOB with therapy on day one. Patient was seen in rounds and was ready to go home.   Diet: Cardiac diet Activity:Wear sling on right shoulder at all times Follow-up:in 2 weeks Disposition - Home Discharged Condition: good   Discharge Instructions   Call MD / Call 911    Complete by:  As directed   If you experience chest pain or shortness of breath, CALL 911 and be transported to the hospital emergency room.  If you develope a  fever above 101 F, pus (white drainage) or increased drainage or redness at the wound, or calf pain, call your surgeon's office.     Constipation Prevention    Complete by:  As directed   Drink plenty of fluids.  Prune juice may be helpful.  You may use a stool softener, such as Colace (over the counter) 100 mg twice a day.  Use MiraLax (over the counter) for constipation as needed.     Diet - low sodium heart healthy    Complete by:  As directed      Discharge instructions    Complete by:  As directed   Keep your sling on at all times, including sleeping in your sling. The only time you should remove your sling is to shower only but you need to keep your hand against your chest while you shower.  Do not change your dressing  Call Dr. Darrelyn HillockGioffre if any wound complications or temperature of 101 degrees F or over.  Call the office for an appointment to see Dr. Darrelyn HillockGioffre in two weeks: [phone] and ask for Dr. Jeannetta EllisGioffre's nurse, Mackey Birchwoodammy Johnson.     Driving restrictions    Complete by:  As directed   No driving     Increase activity slowly as tolerated    Complete by:  As directed      Lifting restrictions    Complete by:  As directed   No lifting            Medication List    STOP taking these medications       acetaminophen 500 MG tablet  Commonly known as:  TYLENOL      TAKE these medications       finasteride 5 MG tablet  Commonly known as:  PROSCAR  Take 5 mg by mouth every morning.     HYDROcodone-acetaminophen 5-325 MG per tablet  Commonly known as:  NORCO/VICODIN  Take 1-2 tablets by mouth every 4 (four) hours as needed for moderate pain.     methocarbamol 500 MG tablet  Commonly known as:  ROBAXIN  Take 1 tablet (500 mg total) by mouth every 6 (six) hours as needed for muscle spasms.     metoprolol 50 MG tablet  Commonly known as:  LOPRESSOR  Take 50 mg by mouth every morning.     warfarin 2.5 MG tablet  Commonly known as:  COUMADIN  Take 2.5-5 mg by mouth See  admin instructions. He takes everyday except for on Monday and Wednesday. He takes two tablets on those two days.           Follow-up Information   Follow up with GIOFFRE,RONALD A, MD. Schedule an appointment as soon as possible for a visit in 2 weeks.   Specialty:  Orthopedic Surgery   Contact information:   [address] [place] [address] [phone]       Signed: Dimitri PedAmber Symphanie Cederberg, PA-C Orthopaedic Surgery 08/01/2014, 8:07 AM

## 2014-08-02 ENCOUNTER — Ambulatory Visit (HOSPITAL_COMMUNITY): Payer: Medicare HMO

## 2014-08-17 ENCOUNTER — Ambulatory Visit (HOSPITAL_COMMUNITY)
Admission: RE | Admit: 2014-08-17 | Discharge: 2014-08-17 | Disposition: A | Discharge status: Home / Self Care | Payer: Medicare HMO | Source: Ambulatory Visit | Attending: Diagnostic Radiology | Admitting: Diagnostic Radiology

## 2014-08-17 DIAGNOSIS — I712 Thoracic aortic aneurysm, without rupture, unspecified: Secondary | ICD-10-CM | POA: Insufficient documentation

## 2014-08-17 DIAGNOSIS — R9389 Abnormal findings on diagnostic imaging of other specified body structures: Secondary | ICD-10-CM | POA: Diagnosis present

## 2014-08-17 DIAGNOSIS — J984 Other disorders of lung: Secondary | ICD-10-CM | POA: Insufficient documentation

## 2019-10-23 ENCOUNTER — Emergency Department (HOSPITAL_BASED_OUTPATIENT_CLINIC_OR_DEPARTMENT_OTHER): Payer: Medicare HMO

## 2019-10-23 ENCOUNTER — Other Ambulatory Visit: Payer: Self-pay

## 2019-10-23 ENCOUNTER — Emergency Department (HOSPITAL_BASED_OUTPATIENT_CLINIC_OR_DEPARTMENT_OTHER)
Admission: EM | Admit: 2019-10-23 | Discharge: 2019-10-23 | Disposition: A | Discharge status: Home / Self Care | Payer: Medicare HMO | Attending: Emergency Medicine | Admitting: Emergency Medicine

## 2019-10-23 ENCOUNTER — Encounter (HOSPITAL_BASED_OUTPATIENT_CLINIC_OR_DEPARTMENT_OTHER): Payer: Self-pay | Admitting: *Deleted

## 2019-10-23 DIAGNOSIS — I10 Essential (primary) hypertension: Secondary | ICD-10-CM | POA: Insufficient documentation

## 2019-10-23 DIAGNOSIS — Z86718 Personal history of other venous thrombosis and embolism: Secondary | ICD-10-CM | POA: Insufficient documentation

## 2019-10-23 DIAGNOSIS — Z79899 Other long term (current) drug therapy: Secondary | ICD-10-CM | POA: Insufficient documentation

## 2019-10-23 DIAGNOSIS — G44209 Tension-type headache, unspecified, not intractable: Secondary | ICD-10-CM | POA: Diagnosis not present

## 2019-10-23 DIAGNOSIS — R519 Headache, unspecified: Secondary | ICD-10-CM

## 2019-10-23 DIAGNOSIS — Z886 Allergy status to analgesic agent status: Secondary | ICD-10-CM | POA: Diagnosis not present

## 2019-10-23 HISTORY — DX: Calculus of gallbladder without cholecystitis without obstruction: K80.20

## 2019-10-23 LAB — CBC
HCT: 44.5 % (ref 39.0–52.0)
Hemoglobin: 14.5 g/dL (ref 13.0–17.0)
MCH: 28.9 pg (ref 26.0–34.0)
MCHC: 32.6 g/dL (ref 30.0–36.0)
MCV: 88.8 fL (ref 80.0–100.0)
Platelets: 124 10*3/uL — ABNORMAL LOW (ref 150–400)
RBC: 5.01 MIL/uL (ref 4.22–5.81)
RDW: 12.8 % (ref 11.5–15.5)
WBC: 6.6 10*3/uL (ref 4.0–10.5)
nRBC: 0 % (ref 0.0–0.2)

## 2019-10-23 LAB — BASIC METABOLIC PANEL
Anion gap: 8 (ref 5–15)
BUN: 25 mg/dL — ABNORMAL HIGH (ref 8–23)
CO2: 23 mmol/L (ref 22–32)
Calcium: 8.7 mg/dL — ABNORMAL LOW (ref 8.9–10.3)
Chloride: 107 mmol/L (ref 98–111)
Creatinine, Ser: 1.18 mg/dL (ref 0.61–1.24)
GFR calc Af Amer: >60 mL/min (ref >60)
GFR calc non Af Amer: 58 mL/min — ABNORMAL LOW (ref >60)
Glucose, Bld: 106 mg/dL — ABNORMAL HIGH (ref 70–99)
Potassium: 4.6 mmol/L (ref 3.5–5.1)
Sodium: 138 mmol/L (ref 135–145)

## 2019-10-23 LAB — PROTIME-INR
INR: 1.8 — ABNORMAL HIGH (ref 0.8–1.2)
Prothrombin Time: 21 seconds — ABNORMAL HIGH (ref 11.4–15.2)

## 2019-10-23 MED ORDER — LABETALOL HCL 5 MG/ML IV SOLN
10.0000 mg | Freq: Once | INTRAVENOUS | Status: AC
  Administered 2019-10-23: 10 mg via INTRAVENOUS
  Filled 2019-10-23: qty 4

## 2019-10-23 MED ORDER — MORPHINE SULFATE (PF) 4 MG/ML IV SOLN
4.0000 mg | Freq: Once | INTRAVENOUS | Status: AC
  Administered 2019-10-23: 4 mg via INTRAVENOUS
  Filled 2019-10-23: qty 1

## 2019-10-23 NOTE — Discharge Instructions (Signed)
Continue your current medications.  Follow-up with your primary care doctor tomorrow as planned to recheck your blood pressure and see whether or not your medications need to be adjusted.  Return as needed for worsening symptoms.

## 2019-10-23 NOTE — ED Notes (Signed)
Pt on monitor 

## 2019-10-23 NOTE — ED Triage Notes (Signed)
C/o ha onset this am  Checked bp and it was high, denies any other complaint

## 2019-10-23 NOTE — ED Notes (Signed)
Patient transported to CT 

## 2019-10-23 NOTE — ED Provider Notes (Signed)
[facility]PART[facility] Provider Note   CSN: [id number] Arrival date & time: 10/23/19  1711     History   Chief Complaint Chief Complaint  Patient presents with  . Hypertension    HPI Alexander Smith is a 78 y.o. male.     HPI Pt developed a bad headache today and noticed his blood pressure was very elevated.  The headache gradually started around 11am.  It continues to get worse.  The headache is all over, dull.  No nausea or vomiting.  No numbness or weakness.  Pt also noticed his blood pressure is very elevated.  Pt did take his bp medications today. No chest pain or shortness of breath.  No fevers or chills.  Past Medical History:  Diagnosis Date  . Arthritis   . Chronic anticoagulation   . DVT (deep venous thrombosis) (HCC)    left leg  . Gall stones   . History of kidney stones   . Hypertension   . Prostate enlargement     Patient Active Problem List   Diagnosis Date Noted  . 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 Complete tear of right rotator cuff 07/26/2014  . Complete rotator cuff tear 07/26/2014  . Hand laceration 10/23/2013    Past Surgical History:  Procedure Laterality Date  . BACK SURGERY     lumbar  . EYE SURGERY Bilateral    cataract extraction with IOL  . HERNIA REPAIR    . SHOULDER OPEN ROTATOR CUFF REPAIR Right 07/26/2014   Procedure: RIGHT ROTATOR CUFF REPAIR SHOULDER OPEN ;  Surgeon: Tobi Bastos, MD;  Location: [facility] ORS;  Service: Orthopedics;  Laterality: Right;        Home Medications    Prior to Admission medications   Medication Sig Start Date End Date Taking? Authorizing Provider  finasteride (PROSCAR) 5 MG tablet Take 5 mg by mouth every morning.    [provider]  HYDROcodone-acetaminophen (NORCO/VICODIN) 5-325 MG per tablet Take 1-2 tablets by mouth every 4 (four) hours as needed for moderate pain. 07/27/14   Constable, Amber, PA-C  methocarbamol (ROBAXIN) 500 MG tablet Take 1 tablet (500  mg total) by mouth every 6 (six) hours as needed for muscle spasms. 07/27/14   Constable, Amber, PA-C  metoprolol (LOPRESSOR) 50 MG tablet Take 50 mg by mouth every morning.    [provider]  warfarin (COUMADIN) 2.5 MG tablet Take 2.5-5 mg by mouth See admin instructions. He takes everyday except for on Monday and Wednesday. He takes two tablets on those two days.    [provider]    Family History No family history on file.  Social History Social History   Tobacco Use  . Smoking status: Never Smoker  . Smokeless tobacco: Never Used  Substance Use Topics  . Alcohol use: No    Comment: occ beer  . Drug use: No     Allergies   Aspirin   Review of Systems Review of Systems  All other systems reviewed and are negative.    Physical Exam Updated Vital Signs BP (!) 168/77   Pulse 61   Temp 98.7 F (37.1 C) (Oral)   Resp 20   Ht 1.753 m (5\' 9" )   Wt 81.6 kg   SpO2 96%   BMI 26.58 kg/m   Physical Exam Vitals signs and nursing note reviewed.  Constitutional:      General: He is not in acute distress.    Appearance: He is well-developed.  HENT:     Head: Normocephalic and atraumatic.     Right Ear: External ear normal.     Left Ear: External ear normal.  Eyes:     General: No scleral icterus.       Right eye: No discharge.        Left eye: No discharge.     Conjunctiva/sclera: Conjunctivae normal.  Neck:     Musculoskeletal: Neck supple.     Trachea: No tracheal deviation.  Cardiovascular:     Rate and Rhythm: Normal rate and regular rhythm.  Pulmonary:     Effort: Pulmonary effort is normal. No respiratory distress.     Breath sounds: Normal breath sounds. No stridor. No wheezing or rales.  Abdominal:     General: Bowel sounds are normal. There is no distension.     Palpations: Abdomen is soft.     Tenderness: There is no abdominal tenderness. There is no guarding or rebound.  Musculoskeletal:        General: No tenderness.  Skin:     General: Skin is warm and dry.     Findings: No rash.  Neurological:     Mental Status: He is alert and oriented to person, place, and time.     Cranial Nerves: No cranial nerve deficit (no facial droop, extraocular movements intact, no slurred speech).     Sensory: No sensory deficit.     Motor: No abnormal muscle tone or seizure activity.     Coordination: Coordination normal.     Comments: No pronator drift bilateral upper extrem, able to hold both legs off bed for 5 seconds, sensation intact in all extremities, no visual field cuts, no left or right sided neglect,  no nystagmus noted       ED Treatments / Results  Labs (all labs ordered are listed, but only abnormal results are displayed) Labs Reviewed  BASIC METABOLIC PANEL - Abnormal; Notable for the following components:      Result Value   Glucose, Bld 106 (*)    BUN 25 (*)    Calcium 8.7 (*)    GFR calc non Af Amer 58 (*)    All other components within normal limits  CBC - Abnormal; Notable for the following components:   Platelets 124 (*)    All other components within normal limits  PROTIME-INR - Abnormal; Notable for the following components:   Prothrombin Time 21.0 (*)    INR 1.8 (*)    All other components within normal limits    EKG EKG Interpretation  Date/Time:  Monday October 23 2019 18:28:01 EST Ventricular Rate:  71 PR Interval:    QRS Duration: 155 QT Interval:  431 QTC Calculation: 469 R Axis:   -20 Text Interpretation: Sinus rhythm Left bundle branch block No significant change since last tracing Confirmed by Linwood Dibbles [phone]) on 10/23/2019 6:35:07 PM   Radiology Ct Head Wo Contrast  Result Date: 10/23/2019 CLINICAL DATA:  Severe headache. EXAM: CT HEAD WITHOUT CONTRAST TECHNIQUE: Contiguous axial images were obtained from the base of the skull through the vertex without intravenous contrast. COMPARISON:  None. FINDINGS: Brain: No evidence of acute infarction, hemorrhage, hydrocephalus,  extra-axial collection or mass lesion/mass effect. Vascular: There is slight increased asymmetric density involving the left M2 division. This is felt to be secondary to atherosclerotic changes. Skull: Normal. Negative for fracture or focal lesion. Sinuses/Orbits: There is a mucosal retention cyst involving the left maxillary sinus. The remaining paranasal sinuses and mastoid air  cells are essentially clear. Other: None. IMPRESSION: No acute intracranial abnormality. Electronically Signed   By: Katherine Mantlehristopher  Green M.D.   On: 10/23/2019 18:20    Procedures Procedures (including critical care time)  Medications Ordered in ED Medications  labetalol (NORMODYNE) injection 10 mg (10 mg Intravenous Given 10/23/19 1814)  morphine 4 MG/ML injection 4 mg (4 mg Intravenous Given 10/23/19 1820)     Initial Impression / Assessment and Plan / ED Course  I have reviewed the triage vital signs and the nursing notes.  Pertinent labs & imaging results that were available during my care of the patient were reviewed by me and considered in my medical decision making (see chart for details).  Clinical Course as of Oct 23 2011  Mon Oct 23, 2019  2002 Blood pressure has improved.  Laboratory tests were done significant abnormalities.   [JK]  2003 T scan without acute findings.   [JK]    Clinical Course User Index [JK] Linwood DibblesKnapp, Jaydence Vanyo, MD     ED work-up reassuring.  No focal neurologic deficits noted on exam.  Head CT without acute findings.  Symptom was gradual in onset.  I doubt subarachnoid hemorrhage based on his history.  No fevers or chills.  I doubt meningitis.  I do not think lumbar puncture is necessary.  Patient's headache is resolved with treatment.  Blood pressure has improved.  Patient does have an appointment with his primary care doctor tomorrow.  I will have him recheck his blood pressure then to determine whether or not he needs to have adjustment of his blood pressure medications.  Final Clinical  Impressions(s) / ED Diagnoses   Final diagnoses:  Hypertension, unspecified type  Acute nonintractable headache, unspecified headache type    ED Discharge Orders    None       Linwood DibblesKnapp, Ricki Clack, MD 10/23/19 2013

## 2020-03-12 ENCOUNTER — Other Ambulatory Visit: Payer: Self-pay

## 2020-03-12 ENCOUNTER — Emergency Department (HOSPITAL_COMMUNITY)
Admission: EM | Admit: 2020-03-12 | Discharge: 2020-03-12 | Disposition: A | Discharge status: Home / Self Care | Payer: Medicare HMO | Attending: Emergency Medicine | Admitting: Emergency Medicine

## 2020-03-12 ENCOUNTER — Encounter (HOSPITAL_COMMUNITY): Payer: Self-pay | Admitting: Emergency Medicine

## 2020-03-12 ENCOUNTER — Emergency Department (HOSPITAL_COMMUNITY): Payer: Medicare HMO

## 2020-03-12 DIAGNOSIS — Z23 Encounter for immunization: Secondary | ICD-10-CM | POA: Insufficient documentation

## 2020-03-12 DIAGNOSIS — Z7901 Long term (current) use of anticoagulants: Secondary | ICD-10-CM | POA: Diagnosis not present

## 2020-03-12 DIAGNOSIS — S0101XA Laceration without foreign body of scalp, initial encounter: Secondary | ICD-10-CM | POA: Diagnosis not present

## 2020-03-12 DIAGNOSIS — E860 Dehydration: Secondary | ICD-10-CM

## 2020-03-12 DIAGNOSIS — Y939 Activity, unspecified: Secondary | ICD-10-CM | POA: Insufficient documentation

## 2020-03-12 DIAGNOSIS — W01198A Fall on same level from slipping, tripping and stumbling with subsequent striking against other object, initial encounter: Secondary | ICD-10-CM | POA: Insufficient documentation

## 2020-03-12 DIAGNOSIS — Y999 Unspecified external cause status: Secondary | ICD-10-CM | POA: Insufficient documentation

## 2020-03-12 DIAGNOSIS — R55 Syncope and collapse: Secondary | ICD-10-CM | POA: Diagnosis not present

## 2020-03-12 DIAGNOSIS — Z79899 Other long term (current) drug therapy: Secondary | ICD-10-CM | POA: Insufficient documentation

## 2020-03-12 DIAGNOSIS — Y92524 Gas station as the place of occurrence of the external cause: Secondary | ICD-10-CM | POA: Diagnosis not present

## 2020-03-12 DIAGNOSIS — Z86718 Personal history of other venous thrombosis and embolism: Secondary | ICD-10-CM | POA: Insufficient documentation

## 2020-03-12 LAB — BASIC METABOLIC PANEL
Anion gap: 12 (ref 5–15)
BUN: 47 mg/dL — ABNORMAL HIGH (ref 8–23)
CO2: 18 mmol/L — ABNORMAL LOW (ref 22–32)
Calcium: 8.7 mg/dL — ABNORMAL LOW (ref 8.9–10.3)
Chloride: 104 mmol/L (ref 98–111)
Creatinine, Ser: 1.8 mg/dL — ABNORMAL HIGH (ref 0.61–1.24)
GFR calc Af Amer: 40 mL/min — ABNORMAL LOW (ref >60)
GFR calc non Af Amer: 35 mL/min — ABNORMAL LOW (ref >60)
Glucose, Bld: 122 mg/dL — ABNORMAL HIGH (ref 70–99)
Potassium: 4.6 mmol/L (ref 3.5–5.1)
Sodium: 134 mmol/L — ABNORMAL LOW (ref 135–145)

## 2020-03-12 LAB — CBC
HCT: 41.7 % (ref 39.0–52.0)
Hemoglobin: 14 g/dL (ref 13.0–17.0)
MCH: 30.9 pg (ref 26.0–34.0)
MCHC: 33.6 g/dL (ref 30.0–36.0)
MCV: 92.1 fL (ref 80.0–100.0)
Platelets: 221 10*3/uL (ref 150–400)
RBC: 4.53 MIL/uL (ref 4.22–5.81)
RDW: 13.2 % (ref 11.5–15.5)
WBC: 9.2 10*3/uL (ref 4.0–10.5)
nRBC: 0 % (ref 0.0–0.2)

## 2020-03-12 LAB — PROTIME-INR
INR: 1.6 — ABNORMAL HIGH (ref 0.8–1.2)
Prothrombin Time: 19 s — ABNORMAL HIGH (ref 11.4–15.2)

## 2020-03-12 MED ORDER — SODIUM CHLORIDE 0.9 % IV BOLUS
1000.0000 mL | Freq: Once | INTRAVENOUS | Status: AC
  Administered 2020-03-12: 21:00:00 1000 mL via INTRAVENOUS

## 2020-03-12 MED ORDER — LIDOCAINE-EPINEPHRINE (PF) 2 %-1:200000 IJ SOLN
20.0000 mL | Freq: Once | INTRAMUSCULAR | Status: AC
  Administered 2020-03-12: 20 mL
  Filled 2020-03-12: qty 20

## 2020-03-12 MED ORDER — TETANUS-DIPHTH-ACELL PERTUSSIS 5-2.5-18.5 LF-MCG/0.5 IM SUSP
0.5000 mL | Freq: Once | INTRAMUSCULAR | Status: AC
  Administered 2020-03-12: 0.5 mL via INTRAMUSCULAR
  Filled 2020-03-12: qty 0.5

## 2020-03-12 NOTE — ED Provider Notes (Signed)
[facility] EMERGENCY DEPARTMENT Provider Note   CSN: [id number] Arrival date & time: 03/12/20  1801     History Chief Complaint  Patient presents with  . Loss of Consciousness    Alexander Smith is a 79 y.o. male.  Presents to ER after syncopal episode.  At gas station with friends, states he passed out from standing, landing on back of his head.  No postictal period, no bladder or bowel incontinence, no reported seizure-like activity.  Patient states he does not remember feeling lightheaded or having any chest pain or difficulty in breathing before.  He currently has no symptoms.  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  Unsure of last tetanus.  No neck or back pain, no extremity pain  HPI     Past Medical History:  Diagnosis Date  . Arthritis   . Chronic anticoagulation   . DVT (deep venous thrombosis) (HCC)    left leg  . Gall stones   . History of kidney stones   . Hypertension   . Prostate enlargement     Patient Active Problem List   Diagnosis Date Noted  . 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 Complete tear of right rotator cuff 07/26/2014  . Complete rotator cuff tear 07/26/2014  . Hand laceration 10/23/2013    Past Surgical History:  Procedure Laterality Date  . BACK SURGERY     lumbar  . EYE SURGERY Bilateral    cataract extraction with IOL  . HERNIA REPAIR    . SHOULDER OPEN ROTATOR CUFF REPAIR Right 07/26/2014   Procedure: RIGHT ROTATOR CUFF REPAIR SHOULDER OPEN ;  Surgeon: Jacki Cones, MD;  Location: [facility];  Service: Orthopedics;  Laterality: Right;       No family history on file.  Social History   Tobacco Use  . Smoking status: Never Smoker  . Smokeless tobacco: Never Used  Substance Use Topics  . Alcohol use: No    Comment: occ beer  . Drug use: No    Home Medications Prior to Admission medications   Medication Sig Start Date End Date Taking? Authorizing Provider  finasteride (PROSCAR) 5 MG tablet Take  5 mg by mouth every morning.   Yes [provider]  lisinopril (ZESTRIL) 20 MG tablet Take 20 mg by mouth daily. 02/20/20  Yes [provider]  metoprolol (LOPRESSOR) 50 MG tablet Take 50 mg by mouth every morning.   Yes [provider]  warfarin (COUMADIN) 2.5 MG tablet Take 2.5-5 mg by mouth daily.    Yes [provider]  HYDROcodone-acetaminophen (NORCO/VICODIN) 5-325 MG per tablet Take 1-2 tablets by mouth every 4 (four) hours as needed for moderate pain. Patient not taking: Reported on 03/12/2020 07/27/14   Dimitri Ped, PA-C  methocarbamol (ROBAXIN) 500 MG tablet Take 1 tablet (500 mg total) by mouth every 6 (six) hours as needed for muscle spasms. Patient not taking: Reported on 03/12/2020 07/27/14   Dimitri Ped, PA-C    Allergies    Aspirin  Review of Systems   Review of Systems  Constitutional: Negative for chills and fever.  HENT: Negative for ear pain and sore throat.   Eyes: Negative for pain and visual disturbance.  Respiratory: Negative for cough and shortness of breath.   Cardiovascular: Negative for chest pain and palpitations.  Gastrointestinal: Negative for abdominal pain and vomiting.  Genitourinary: Negative for dysuria and hematuria.  Musculoskeletal: Negative for arthralgias and back pain.  Skin: Negative for color change and  rash.  Neurological: Positive for syncope. Negative for seizures and headaches.  All other systems reviewed and are negative.   Physical Exam Updated Vital Signs BP 136/81   Pulse 66   Temp 98.2 F (36.8 C) (Oral)   Resp 17   Ht 5\' 9"  (1.753 m)   Wt 80.7 kg   SpO2 98%   BMI 26.29 kg/m   Physical Exam Vitals and nursing note reviewed.  Constitutional:      Appearance: He is well-developed.  HENT:     Head: Normocephalic.     Comments: 3cm laceration over posterior occiput Eyes:     Conjunctiva/sclera: Conjunctivae normal.  Cardiovascular:     Rate and Rhythm: Normal rate and regular  rhythm.     Heart sounds: No murmur.  Pulmonary:     Effort: Pulmonary effort is normal. No respiratory distress.     Breath sounds: Normal breath sounds.  Abdominal:     Palpations: Abdomen is soft.     Tenderness: There is no abdominal tenderness.  Musculoskeletal:        General: No deformity or signs of injury.     Cervical back: Neck supple.     Comments: No TTP over C, T, L spine No TTP throughout all four extremities  Skin:    General: Skin is warm and dry.  Neurological:     General: No focal deficit present.     Mental Status: He is alert and oriented to person, place, and time.     ED Results / Procedures / Treatments   Labs (all labs ordered are listed, but only abnormal results are displayed) Labs Reviewed  BASIC METABOLIC PANEL - Abnormal; Notable for the following components:      Result Value   Sodium 134 (*)    CO2 18 (*)    Glucose, Bld 122 (*)    BUN 47 (*)    Creatinine, Ser 1.80 (*)    Calcium 8.7 (*)    GFR calc non Af Amer 35 (*)    GFR calc Af Amer 40 (*)    All other components within normal limits  PROTIME-INR - Abnormal; Notable for the following components:   Prothrombin Time 19.0 (*)    INR 1.6 (*)    All other components within normal limits  CBC    EKG EKG Interpretation  Date/Time:  Tuesday March 12 2020 18:01:43 EDT Ventricular Rate:  73 PR Interval:    QRS Duration: 160 QT Interval:  448 QTC Calculation: 494 R Axis:   16 Text Interpretation: Sinus rhythm Ventricular premature complex Borderline prolonged PR interval Left bundle branch block Confirmed by 03-09-1989 (Marianna Fuss) on 03/12/2020 9:50:21 PM   Radiology DG Chest 2 View  Result Date: 03/12/2020 CLINICAL DATA:  03/14/2020, syncope EXAM: CHEST - 2 VIEW COMPARISON:  07/24/2014 FINDINGS: The heart size and mediastinal contours are within normal limits. Both lungs are clear. The visualized skeletal structures are unremarkable. IMPRESSION: No active cardiopulmonary disease.  Electronically Signed   By: 09/23/2014 M.D.   On: 03/12/2020 18:49   CT Head Wo Contrast  Result Date: 03/12/2020 CLINICAL DATA:  Head trauma on Coumadin; head trauma, headache. Additional history provided: Witnessed syncopal episode lasting 30-45 seconds, patient fell and hit head, laceration noted to back of head. EXAM: CT HEAD WITHOUT CONTRAST TECHNIQUE: Contiguous axial images were obtained from the base of the skull through the vertex without intravenous contrast. COMPARISON:  Head CT 10/23/2019 FINDINGS: Brain: There is no evidence  of acute intracranial hemorrhage, intracranial mass, midline shift or extra-axial fluid collection.No demarcated cortical infarction. Mild ill-defined hypoattenuation within the cerebral white matter is nonspecific, but consistent with chronic small vessel ischemic disease. Mild generalized parenchymal atrophy. Vascular: No hyperdense vessel.  Atherosclerotic calcifications. Skull: Normal. Negative for fracture or focal lesion. Sinuses/Orbits: Visualized orbits demonstrate no acute abnormality. Mild ethmoid and left maxillary sinus mucosal thickening. Small left maxillary sinus mucous retention cyst. No significant mastoid effusion. Other: Left parietal scalp hematoma with laceration. IMPRESSION: No evidence of acute intracranial abnormality. Left parietal scalp hematoma with laceration. Mild generalized parenchymal atrophy and chronic small vessel ischemic disease. Mild paranasal sinus mucosal thickening. Small left maxillary sinus mucous retention cyst. Electronically Signed   By: Jackey Loge DO   On: 03/12/2020 19:49    Procedures .Marland KitchenLaceration Repair  Date/Time: 03/12/2020 10:25 PM Performed by: Milagros Loll, MD Authorized by: Milagros Loll, MD   Consent:    Consent obtained:  Verbal   Consent given by:  Patient   Risks discussed:  Infection, need for additional repair, nerve damage, poor wound healing, pain, tendon damage, retained foreign body  and vascular damage   Alternatives discussed:  No treatment and delayed treatment Anesthesia (see MAR for exact dosages):    Anesthesia method:  Local infiltration   Local anesthetic:  Lidocaine 2% WITH epi Laceration details:    Location:  Scalp   Scalp location:  Occipital   Length (cm):  3 Repair type:    Repair type:  Simple Exploration:    Hemostasis achieved with:  Direct pressure   Contaminated: no   Treatment:    Area cleansed with:  Saline   Amount of cleaning:  Extensive   Irrigation solution:  Sterile saline   Irrigation volume:  1L   Irrigation method:  Syringe Skin repair:    Repair method:  Staples   Number of staples:  5 Approximation:    Approximation:  Close Post-procedure details:    Dressing:  Open (no dressing)   Patient tolerance of procedure:  Tolerated well, no immediate complications   (including critical care time)  Medications Ordered in ED Medications  lidocaine-EPINEPHrine (XYLOCAINE W/EPI) 2 %-1:200000 (PF) injection 20 mL (20 mLs Infiltration Given 03/12/20 1935)  Tdap (BOOSTRIX) injection 0.5 mL (0.5 mLs Intramuscular Given 03/12/20 1956)  sodium chloride 0.9 % bolus 1,000 mL (0 mLs Intravenous Stopped 03/12/20 2157)    ED Course  I have reviewed the triage vital signs and the nursing notes.  Pertinent labs & imaging results that were available during my care of the patient were reviewed by me and considered in my medical decision making (see chart for details).    MDM Rules/Calculators/A&P                      79 year old male presenting to ER with syncopal episode, scalp laceration.  Here patient was very well-appearing, no ongoing symptoms.  On Coumadin for DVT.  CT head negative for intracranial pathology.  Labs notable for mild AKI.  Suspect most likely dehydration which also likely contributed to syncopal episode.  Given syncope, AKI, discussed and offered admission with patient, he strongly desires to be discharged at this time.  He  understands benefits/risks, is willing to follow-up with primary doctor.  Was willing to stay for IV fluids.  Recommend patient have close PCP follow-up, repeat labs as outpatient.  His INR was mildly subtherapeutic.  Recommended having his primary doctor follow-up on this finding and  adjust meds accordingly.  Scalp laceration was repaired with staples.  Instructed on wound care, hemostasis achieved.  No other injuries identified on exam.  Discharged home.    After the discussed management above, the patient was determined to be safe for discharge.  The patient was in agreement with this plan and all questions regarding their care were answered.  ED return precautions were discussed and the patient will return to the ED with any significant worsening of condition.   Final Clinical Impression(s) / ED Diagnoses Final diagnoses:  Syncope and collapse  Dehydration  Laceration of scalp, initial encounter    Rx / DC Orders ED Discharge Orders    None       Lucrezia Starch, MD 03/12/20 2228

## 2020-03-12 NOTE — ED Triage Notes (Signed)
Pt arrives vis EMS with complaints of witnessed syncopal episode which lasted about 30-45 secs. Pt fell and hit his head. Laceration noted on back of head. Bleeding controlled.

## 2020-03-12 NOTE — ED Notes (Signed)
Patient transported to CT 

## 2020-03-12 NOTE — Discharge Instructions (Signed)
Please call your primary care doctor regarding your episode of passing out as well as your increased creatinine level (kidney function).  Please discuss your low Coumadin levels as well.  Please stay hydrated as discussed.  If you have any episodes of passing out, chest pain, difficulty breathing, fever, return to ER for reassessment.  You will need the staples removed in approximately 7 days.  Your primary doctor can do this.

## 2021-10-09 ENCOUNTER — Encounter (HOSPITAL_COMMUNITY): Payer: Self-pay | Admitting: Emergency Medicine

## 2021-10-09 ENCOUNTER — Inpatient Hospital Stay (HOSPITAL_COMMUNITY)
Admission: EM | Admit: 2021-10-09 | Discharge: 2021-10-11 | DRG: 244 | Disposition: A | Discharge status: Home / Self Care | Payer: Medicare HMO | Source: Ambulatory Visit | Attending: Internal Medicine | Admitting: Internal Medicine

## 2021-10-09 ENCOUNTER — Other Ambulatory Visit: Payer: Self-pay

## 2021-10-09 ENCOUNTER — Emergency Department (HOSPITAL_COMMUNITY): Payer: Medicare HMO

## 2021-10-09 ENCOUNTER — Inpatient Hospital Stay (HOSPITAL_COMMUNITY): Payer: Medicare HMO

## 2021-10-09 DIAGNOSIS — R9431 Abnormal electrocardiogram [ECG] [EKG]: Secondary | ICD-10-CM

## 2021-10-09 DIAGNOSIS — Z79899 Other long term (current) drug therapy: Secondary | ICD-10-CM | POA: Diagnosis not present

## 2021-10-09 DIAGNOSIS — Z20822 Contact with and (suspected) exposure to covid-19: Secondary | ICD-10-CM | POA: Diagnosis present

## 2021-10-09 DIAGNOSIS — Z7901 Long term (current) use of anticoagulants: Secondary | ICD-10-CM

## 2021-10-09 DIAGNOSIS — Z86711 Personal history of pulmonary embolism: Secondary | ICD-10-CM | POA: Diagnosis not present

## 2021-10-09 DIAGNOSIS — N400 Enlarged prostate without lower urinary tract symptoms: Secondary | ICD-10-CM | POA: Diagnosis present

## 2021-10-09 DIAGNOSIS — M1990 Unspecified osteoarthritis, unspecified site: Secondary | ICD-10-CM | POA: Diagnosis present

## 2021-10-09 DIAGNOSIS — I4891 Unspecified atrial fibrillation: Secondary | ICD-10-CM | POA: Diagnosis present

## 2021-10-09 DIAGNOSIS — Z886 Allergy status to analgesic agent status: Secondary | ICD-10-CM

## 2021-10-09 DIAGNOSIS — I442 Atrioventricular block, complete: Secondary | ICD-10-CM | POA: Diagnosis present

## 2021-10-09 DIAGNOSIS — N1831 Chronic kidney disease, stage 3a: Secondary | ICD-10-CM | POA: Diagnosis present

## 2021-10-09 DIAGNOSIS — J189 Pneumonia, unspecified organism: Secondary | ICD-10-CM

## 2021-10-09 DIAGNOSIS — R001 Bradycardia, unspecified: Secondary | ICD-10-CM | POA: Diagnosis present

## 2021-10-09 DIAGNOSIS — Z950 Presence of cardiac pacemaker: Secondary | ICD-10-CM

## 2021-10-09 DIAGNOSIS — I129 Hypertensive chronic kidney disease with stage 1 through stage 4 chronic kidney disease, or unspecified chronic kidney disease: Secondary | ICD-10-CM | POA: Diagnosis present

## 2021-10-09 LAB — CBC WITH DIFFERENTIAL/PLATELET
Abs Immature Granulocytes: 0.05 10*3/uL (ref 0.00–0.07)
Basophils Absolute: 0.1 10*3/uL (ref 0.0–0.1)
Basophils Relative: 1 %
Eosinophils Absolute: 0.1 10*3/uL (ref 0.0–0.5)
Eosinophils Relative: 1 %
HCT: 42.2 % (ref 39.0–52.0)
Hemoglobin: 13.6 g/dL (ref 13.0–17.0)
Immature Granulocytes: 1 %
Lymphocytes Relative: 14 %
Lymphs Abs: 1.6 10*3/uL (ref 0.7–4.0)
MCH: 30 pg (ref 26.0–34.0)
MCHC: 32.2 g/dL (ref 30.0–36.0)
MCV: 93.2 fL (ref 80.0–100.0)
Monocytes Absolute: 1.1 10*3/uL — ABNORMAL HIGH (ref 0.1–1.0)
Monocytes Relative: 10 %
Neutro Abs: 8 10*3/uL — ABNORMAL HIGH (ref 1.7–7.7)
Neutrophils Relative %: 73 %
Platelets: 197 10*3/uL (ref 150–400)
RBC: 4.53 MIL/uL (ref 4.22–5.81)
RDW: 13.2 % (ref 11.5–15.5)
WBC: 10.8 10*3/uL — ABNORMAL HIGH (ref 4.0–10.5)
nRBC: 0 % (ref 0.0–0.2)

## 2021-10-09 LAB — I-STAT CHEM 8, ED
BUN: 42 mg/dL — ABNORMAL HIGH (ref 8–23)
Calcium, Ion: 1.14 mmol/L — ABNORMAL LOW (ref 1.15–1.40)
Chloride: 107 mmol/L (ref 98–111)
Creatinine, Ser: 2 mg/dL — ABNORMAL HIGH (ref 0.61–1.24)
Glucose, Bld: 115 mg/dL — ABNORMAL HIGH (ref 70–99)
HCT: 40 % (ref 39.0–52.0)
Hemoglobin: 13.6 g/dL (ref 13.0–17.0)
Potassium: 4.4 mmol/L (ref 3.5–5.1)
Sodium: 139 mmol/L (ref 135–145)
TCO2: 21 mmol/L — ABNORMAL LOW (ref 22–32)

## 2021-10-09 LAB — COMPREHENSIVE METABOLIC PANEL
ALT: 77 U/L — ABNORMAL HIGH (ref 0–44)
AST: 40 U/L (ref 15–41)
Albumin: 3.8 g/dL (ref 3.5–5.0)
Alkaline Phosphatase: 51 U/L (ref 38–126)
Anion gap: 10 (ref 5–15)
BUN: 42 mg/dL — ABNORMAL HIGH (ref 8–23)
CO2: 20 mmol/L — ABNORMAL LOW (ref 22–32)
Calcium: 8.8 mg/dL — ABNORMAL LOW (ref 8.9–10.3)
Chloride: 105 mmol/L (ref 98–111)
Creatinine, Ser: 1.97 mg/dL — ABNORMAL HIGH (ref 0.61–1.24)
GFR, Estimated: 33 mL/min — ABNORMAL LOW (ref >60)
Glucose, Bld: 115 mg/dL — ABNORMAL HIGH (ref 70–99)
Potassium: 4.3 mmol/L (ref 3.5–5.1)
Sodium: 135 mmol/L (ref 135–145)
Total Bilirubin: 1.7 mg/dL — ABNORMAL HIGH (ref 0.3–1.2)
Total Protein: 6.8 g/dL (ref 6.5–8.1)

## 2021-10-09 LAB — RESP PANEL BY RT-PCR (FLU A&B, COVID) ARPGX2
Influenza A by PCR: NEGATIVE
Influenza B by PCR: NEGATIVE
SARS Coronavirus 2 by RT PCR: NEGATIVE

## 2021-10-09 LAB — ECHOCARDIOGRAM COMPLETE
AR max vel: 1.95 cm2
AV Area VTI: 1.97 cm2
AV Area mean vel: 2.09 cm2
AV Mean grad: 16 mmHg
AV Peak grad: 29.4 mmHg
Ao pk vel: 2.71 m/s
Area-P 1/2: 4.04 cm2
Calc EF: 58.5 %
Height: 69 in
S' Lateral: 2.8 cm
Single Plane A2C EF: 60.4 %
Single Plane A4C EF: 57.7 %
Weight: 2880 oz

## 2021-10-09 LAB — BRAIN NATRIURETIC PEPTIDE: B Natriuretic Peptide: 741.4 pg/mL — ABNORMAL HIGH (ref 0.0–100.0)

## 2021-10-09 LAB — TROPONIN I (HIGH SENSITIVITY)
Troponin I (High Sensitivity): 57 ng/L — ABNORMAL HIGH (ref <18)
Troponin I (High Sensitivity): 64 ng/L — ABNORMAL HIGH (ref <18)

## 2021-10-09 LAB — MAGNESIUM: Magnesium: 2 mg/dL (ref 1.7–2.4)

## 2021-10-09 MED ORDER — SODIUM CHLORIDE 0.9 % IV SOLN
1.0000 g | Freq: Once | INTRAVENOUS | Status: AC
  Administered 2021-10-09: 1 g via INTRAVENOUS
  Filled 2021-10-09: qty 10

## 2021-10-09 MED ORDER — ACETAMINOPHEN 325 MG PO TABS: 650.0000 mg | ORAL_TABLET | ORAL | Status: AC | PRN

## 2021-10-09 MED ORDER — ONDANSETRON HCL 4 MG/2ML IJ SOLN: 4.0000 mg | Freq: Four times a day (QID) | INTRAMUSCULAR | Status: DC | PRN

## 2021-10-09 MED ORDER — SODIUM CHLORIDE 0.9 % IV SOLN: 500.0000 mg | Freq: Once | INTRAVENOUS | Status: DC

## 2021-10-09 MED ORDER — FINASTERIDE 5 MG PO TABS
5.0000 mg | ORAL_TABLET | Freq: Every morning | ORAL | Status: DC
  Administered 2021-10-10 – 2021-10-11 (×2): 5 mg via ORAL
  Filled 2021-10-09: qty 1

## 2021-10-09 MED ORDER — DOXYCYCLINE HYCLATE 100 MG PO TABS
100.0000 mg | ORAL_TABLET | Freq: Two times a day (BID) | ORAL | Status: DC
  Administered 2021-10-09 – 2021-10-10 (×3): 100 mg via ORAL
  Filled 2021-10-09 (×3): qty 1

## 2021-10-09 MED ORDER — NITROGLYCERIN 0.4 MG SL SUBL
0.4000 mg | SUBLINGUAL_TABLET | SUBLINGUAL | Status: DC | PRN
Start: 2021-10-09 — End: 2021-10-11

## 2021-10-09 MED ORDER — HYDRALAZINE HCL 25 MG PO TABS
25.0000 mg | ORAL_TABLET | Freq: Two times a day (BID) | ORAL | Status: DC
  Administered 2021-10-09 – 2021-10-11 (×5): 25 mg via ORAL
  Filled 2021-10-09 (×5): qty 1

## 2021-10-09 NOTE — ED Triage Notes (Signed)
Pt arrives via [facility] from PCP for bradycardia. Pt went into PCP for increased exertional shob w/ chest tightness x2-3 days & INR check (coumadin for hx DVT). PCP found pt's HR in the 30s, upon EMS arrival 12-lead showed complete heart block. Pt has no cardiac hx, on BP meds. Aox4, 18g RAC

## 2021-10-09 NOTE — ED Notes (Signed)
Echo at bedside

## 2021-10-09 NOTE — ED Notes (Signed)
Cardiology at bedside.

## 2021-10-09 NOTE — Progress Notes (Signed)
Patient brought to 4E from ED. Son present. Telemetry box applied, CCMD notified. CHG bath completed. Patient oriented to room and staff. Call bell in reach.  Kenard Gower, RN

## 2021-10-09 NOTE — ED Provider Notes (Signed)
[facility] EMERGENCY DEPARTMENT Provider Note   CSN: [id number] Arrival date & time: 10/09/21  1113     History Chief Complaint  Patient presents with   Bradycardia   Shortness of Breath    Alexander Smith is a 80 y.o. male.  HPI 80 year old male presents with chest pain, dyspnea, and bradycardia.  For about a week he has been getting some dyspnea.  He has a nonproductive cough.  Dyspnea is most prominent when he exerts himself.  It got significantly worse yesterday.  At times when he is walking he will get some sharp chest pain.  Otherwise he is felt a little bit of chest tightness.  Grab some small amount of leg swelling.  He chronically has left greater than right leg swelling but he is noticing a little bit in his right leg now.  He is on warfarin and when he went to get his INR checked today, they called EMS because of his low heart rate.  He states he is not lightheaded, dizzy, and has not passed out but sometimes when he exerts himself he will start to feel a little lightheaded.  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 No pertinent family  history.  Social History   Tobacco Use   Smoking status: Never   Smokeless tobacco: Never  Substance Use Topics   Alcohol use: No    Comment: occ beer   Drug use: No    Home Medications Prior to Admission medications   Medication Sig Start Date End Date Taking? Authorizing Provider  finasteride (PROSCAR) 5 MG tablet Take 5 mg by mouth every morning.   Yes [provider]  hydrochlorothiazide (HYDRODIURIL) 25 MG tablet Take 25 mg by mouth daily. 06/25/21  Yes [provider]  lisinopril (ZESTRIL) 20 MG tablet Take 20 mg by mouth daily. 02/20/20  Yes [provider]  metoprolol (LOPRESSOR) 50 MG tablet Take 50 mg by mouth every morning.   Yes [provider]  warfarin (COUMADIN) 2.5 MG tablet Take 2.5 mg by mouth See admin instructions. 2.5mg  daily on Tuesday Wednesday Thursday Saturday Sunday   Yes [provider]  warfarin (COUMADIN) 5 MG tablet Take 5 mg by mouth See admin instructions. 5mg  daily on Monday and Friday 08/05/21  Yes [provider]    Allergies    Aspirin  Review of Systems   Review of Systems  Constitutional:  Negative for fever.  Respiratory:  Positive for cough, chest tightness and shortness of breath.  Cardiovascular:  Positive for chest pain and leg swelling.  Neurological:  Negative for syncope.  All other systems reviewed and are negative.  Physical Exam Updated Vital Signs BP (!) 169/89   Pulse (!) 38   Temp 98.5 F (36.9 C) (Temporal)   Resp 13   Ht 5\' 9"  (1.753 m)   Wt 81.6 kg   SpO2 93%   BMI 26.58 kg/m   Physical Exam Vitals and nursing note reviewed.  Constitutional:      Appearance: He is well-developed.  HENT:     Head: Normocephalic and atraumatic.     Right Ear: External ear normal.     Left Ear: External ear normal.     Nose: Nose normal.  Eyes:     General:        Right eye: No discharge.        Left eye: No discharge.  Cardiovascular:     Rate and Rhythm: Regular  rhythm. Bradycardia present.     Pulses:          Radial pulses are 2+ on the left side.     Heart sounds: Normal heart sounds.  Pulmonary:     Effort: Pulmonary effort is normal. No accessory muscle usage or respiratory distress.     Breath sounds: Normal breath sounds.  Abdominal:     Palpations: Abdomen is soft.     Tenderness: There is no abdominal tenderness.  Musculoskeletal:     Cervical back: Neck supple.     Comments: Trace lower leg edema  Skin:    General: Skin is warm and dry.  Neurological:     Mental Status: He is alert.  Psychiatric:        Mood and Affect: Mood is not anxious.    ED Results / Procedures / Treatments   Labs (all labs ordered are listed, but only abnormal results are displayed) Labs Reviewed  COMPREHENSIVE METABOLIC PANEL - Abnormal; Notable for the following components:      Result Value   CO2 20 (*)    Glucose, Bld 115 (*)    BUN 42 (*)    Creatinine, Ser 1.97 (*)    Calcium 8.8 (*)    ALT 77 (*)    Total Bilirubin 1.7 (*)    GFR, Estimated 33 (*)    All other components within normal limits  BRAIN NATRIURETIC PEPTIDE - Abnormal; Notable for the following components:   B Natriuretic Peptide 741.4 (*)    All other components within normal limits  CBC WITH DIFFERENTIAL/PLATELET - Abnormal; Notable for the following components:   WBC 10.8 (*)    Neutro Abs 8.0 (*)    Monocytes Absolute 1.1 (*)    All other components within normal limits  I-STAT CHEM 8, ED - Abnormal; Notable for the following components:   BUN 42 (*)    Creatinine, Ser 2.00 (*)    Glucose, Bld 115 (*)    Calcium, Ion 1.14 (*)    TCO2 21 (*)    All other components within normal limits  TROPONIN I (HIGH SENSITIVITY) - Abnormal; Notable for the following components:   Troponin I (High Sensitivity) 57 (*)    All other components within normal limits  TROPONIN I (HIGH SENSITIVITY) - Abnormal; Notable for the following components:   Troponin I (High Sensitivity) 64 (*)     All other components within normal limits  RESP PANEL BY RT-PCR (FLU A&B, COVID) ARPGX2  CULTURE, BLOOD (ROUTINE X 2)  CULTURE, BLOOD (ROUTINE X 2)  MAGNESIUM    EKG EKG Interpretation  Date/Time:  Thursday October 09 2021 11:22:20 EDT Ventricular Rate:  32 PR Interval:  422 QRS Duration: 151 QT Interval:  669 QTC Calculation: 489 R Axis:   61 Text Interpretation: Complete (3-degree) AV block Confirmed by Pricilla Loveless [phone]) on 10/09/2021 11:25:32 AM  Radiology DG Chest Portable 1 View  Result Date: 10/09/2021 CLINICAL DATA:  Chest pain, dyspnea. EXAM: PORTABLE CHEST 1 VIEW COMPARISON:  March 12, 2020. FINDINGS: The heart size and mediastinal contours are within normal limits. Mild right basilar atelectasis or infiltrate is noted. Minimal left basilar subsegmental atelectasis is noted. The visualized skeletal structures are unremarkable. IMPRESSION: Mild right basilar atelectasis or infiltrate is noted. Minimal left basilar subsegmental atelectasis. Electronically Signed   By: Lupita Raider M.D.   On: 10/09/2021 11:42    Procedures .Critical Care Performed by: Pricilla Loveless, MD Authorized by: Pricilla Loveless, MD   Critical care provider statement:    Critical care time (minutes):  35   Critical care time was exclusive of:  Separately billable procedures and treating other patients   Critical care was necessary to treat or prevent imminent or life-threatening deterioration of the following conditions:  Cardiac failure   Critical care was time spent personally by me on the following activities:  Development of treatment plan with patient or surrogate, discussions with consultants, evaluation of patient's response to treatment, examination of patient, obtaining history from patient or surrogate, ordering and performing treatments and interventions, ordering and review of laboratory studies, ordering and review of radiographic studies, pulse oximetry, re-evaluation of  patient's condition and review of old charts   Medications Ordered in ED Medications  nitroGLYCERIN (NITROSTAT) SL tablet 0.4 mg (has no administration in time range)  acetaminophen (TYLENOL) tablet 650 mg (has no administration in time range)  ondansetron (ZOFRAN) injection 4 mg (has no administration in time range)  finasteride (PROSCAR) tablet 5 mg (has no administration in time range)  hydrALAZINE (APRESOLINE) tablet 25 mg (25 mg Oral Given 10/09/21 1330)  doxycycline (VIBRA-TABS) tablet 100 mg (100 mg Oral Given 10/09/21 1330)  cefTRIAXone (ROCEPHIN) 1 g in sodium chloride 0.9 % 100 mL IVPB (0 g Intravenous Stopped 10/09/21 1404)    ED Course  I have reviewed the triage vital signs and the nursing notes.  Pertinent labs & imaging results that were available during my care of the patient were reviewed by me and considered in my medical decision making (see chart for details).    MDM Rules/Calculators/A&P                           Patient presents with complete heart block.  He is on metoprolol.  He is not hypotensive or in distress.  I do not think he needs an emergent temporary pacemaker.  He also has a cough for a week and it is unclear if this is mild pneumonia versus some CHF.  We will treat as possible pneumonia with antibiotics.  Cardiology consulted and will admit to washout his beta-blocker and see if he needs a pacemaker.  Otherwise, patient remains relatively stable. Final Clinical Impression(s) / ED Diagnoses Final diagnoses:  Complete heart block (HCC)  Community acquired pneumonia, unspecified laterality    Rx / DC Orders ED Discharge Orders     None        Pricilla Loveless, MD 10/09/21 1443

## 2021-10-09 NOTE — H&P (Addendum)
Cardiology Admission History and Physical:   Patient ID: Alexander Smith MRN: [id number]; DOB: August 18, 1941   Admission date: 10/09/2021  PCP:  Alexander Presto, MD   [facility] Providers Cardiologist:  None    Chief Complaint:  CHB  Patient Profile:   Alexander Smith is a 80 y.o. male with PMHx of Afib, PE (on warfarin, monitored and managed with his PMD), HTN who is being seen 10/09/2021 for the evaluation of CHB.  History of Present Illness:   Alexander Smith was at the clinic for his INR check today, mentioned some SOB, CP, EKG was done and found CHB and EMS was called, he was brought to [facility]  Clinic note mentions his INR today was 2.4  EP is called to see for consideration for PPM and admission  LABS so far K+ 4.4 BUN/Creat 42/2.00 H/H 13/40   HOME meds include lopressor 50mg  daily in the mornings that he confirms he is on (by his list in his wallet) and took this AM as usual No other noted nodal blocking agents   The patient reports until this week he was very active and without exertional incapacities, getting up in the trees cutting limbs and so  on. Suddenly this week he can not hardly walk across the room without feeling SOB and heavy in his chest. No palpitations He has not had any near syncope or syncope this week or of late, though perhaps a year ago he fainted and was told 2/2 dehydration.  He has no rest symptoms He denies any hx of CAD or heart procedures He is not a great medical historian     Past Medical History:  Diagnosis Date   Arthritis    Chronic anticoagulation    DVT (deep venous thrombosis) (HCC)    left leg   Gall stones    History of kidney stones    Hypertension    Prostate enlargement     Past Surgical History:  Procedure Laterality Date   BACK SURGERY     lumbar   EYE SURGERY Bilateral    cataract extraction with IOL   HERNIA REPAIR     SHOULDER OPEN ROTATOR CUFF REPAIR Right 07/26/2014   Procedure: RIGHT ROTATOR  CUFF REPAIR SHOULDER OPEN ;  Surgeon: 07/28/2014, MD;  Location: [facility];  Service: Orthopedics;  Laterality: Right;     Medications Prior to Admission: Prior to Admission medications   Medication Sig Start Date End Date Taking? Authorizing Provider  finasteride (PROSCAR) 5 MG tablet Take 5 mg by mouth every morning.    [provider]  HYDROcodone-acetaminophen (NORCO/VICODIN) 5-325 MG per tablet Take 1-2 tablets by mouth every 4 (four) hours as needed for moderate pain. Patient not taking: Reported on 03/12/2020 07/27/14   [place], 07/29/14, PA-C  lisinopril (ZESTRIL) 20 MG tablet Take 20 mg by mouth daily. 02/20/20   [provider]  methocarbamol (ROBAXIN) 500 MG tablet Take 1 tablet (500 mg total) by mouth every 6 (six) hours as needed for muscle spasms. Patient not taking: Reported on 03/12/2020 07/27/14   [place], 07/29/14, PA-C  metoprolol (LOPRESSOR) 50 MG tablet Take 50 mg by mouth every morning.    [provider]  warfarin (COUMADIN) 2.5 MG tablet Take 2.5-5 mg by mouth daily.     [provider]     Allergies:    Allergies  Allergen Reactions   Aspirin Other (See Comments)    Due to Coumadin    Social History:   Social  History   Socioeconomic History   Marital status: Married    Spouse name: Not on file   Number of children: Not on file   Years of education: Not on file   Highest education level: Not on file  Occupational History   Not on file  Tobacco Use   Smoking status: Never   Smokeless tobacco: Never  Substance and Sexual Activity   Alcohol use: No    Comment: occ beer   Drug use: No   Sexual activity: Not on file  Other Topics Concern   Not on file  Social History Narrative   Not on file   Social Determinants of Health   Financial Resource Strain: Not on file  Food Insecurity: Not on file  Transportation Needs: Not on file  Physical Activity: Not on file  Stress: Not on file  Social Connections: Not on file   Intimate Partner Violence: Not on file    Family History:   His son has CAD, and unclear arrhythmia  ROS:  Please see the history of present illness.  ll other ROS reviewed and negative.     Physical Exam/Data:   Vitals:   10/09/21 1116 10/09/21 1118 10/09/21 1129 10/09/21 1130  BP: (!) 164/79   (!) 183/60  Pulse: (!) 33   (!) 31  Resp: (!) 23   19  Temp:   98.5 F (36.9 C)   TempSrc:   Temporal   SpO2: 93%   93%  Weight:  81.6 kg    Height:  5\' 9"  (1.753 m)     No intake or output data in the 24 hours ending 10/09/21 1155 Last 3 Weights 10/09/2021 03/12/2020 10/23/2019  Weight (lbs) 180 lb 178 lb 180 lb  Weight (kg) 81.647 kg 80.74 kg 81.647 kg     Body mass index is 26.58 kg/m.  General:  Well nourished, well developed, in no acute distress HEENT: normal Neck: no JVD Vascular: No carotid bruits; Distal pulses 2+ bilaterally   Cardiac:  RRR; bradycardic, no murmurs, gallops or rubs Lungs:  CTA b/l, no wheezing, rhonchi or rales  Abd: soft, nontender, no hepatomegaly  Ext: no edema Musculoskeletal:  No deformities, warm extremities Skin: warm and dry  Neuro:  no gross focal abnormalities noted Psych:  Normal affect    EKG:  The ECG that was done today was personally reviewed  with Dr. 13/08/2019 and demonstrates CHB  Telemetry is CHB in the 30s  Relevant CV Studies:   03/27/2008: TTE SUMMARY   -  Overall left ventricular systolic function was normal. Left         ventricular ejection fraction was estimated to be 60 %. There         were no left ventricular regional wall motion abnormalities.   -  Aortic valve thickness was mildly increased. There was lower         normal aortic valve leaflet excursion.   -  Normal mitral valve   Laboratory Data:  High Sensitivity Troponin:  No results for input(s): TROPONINIHS in the last 720 hours.    Chemistry Recent Labs  Lab 10/09/21 1136  NA 139  K 4.4  CL 107  GLUCOSE 115*  BUN 42*  CREATININE 2.00*    No  results for input(s): PROT, ALBUMIN, AST, ALT, ALKPHOS, BILITOT in the last 168 hours. Lipids No results for input(s): CHOL, TRIG, HDL, LABVLDL, LDLCALC, CHOLHDL in the last 168 hours. Hematology Recent Labs  Lab 10/09/21 1136  HGB 13.6  HCT 40.0   Thyroid No results for input(s): TSH, FREET4 in the last 168 hours. BNPNo results for input(s): BNP, PROBNP in the last 168 hours.  DDimer No results for input(s): DDIMER in the last 168 hours.   Radiology/Studies:  DG Chest Portable 1 View  Result Date: 10/09/2021 CLINICAL DATA:  Chest pain, dyspnea. EXAM: PORTABLE CHEST 1 VIEW COMPARISON:  March 12, 2020. FINDINGS: The heart size and mediastinal contours are within normal limits. Mild right basilar atelectasis or infiltrate is noted. Minimal left basilar subsegmental atelectasis is noted. The visualized skeletal structures are unremarkable. IMPRESSION: Mild right basilar atelectasis or infiltrate is noted. Minimal left basilar subsegmental atelectasis. Electronically Signed   By: Lupita Raider M.D.   On: 10/09/2021 11:42     Assessment and Plan:   CHB This is likely the etiology of all of his symptoms  Dr. Ladona Ridgel has seen and examined the patient, discussed findings and plans with the patient and son   Will admit Asymptomatic at rest, BP stable (high) need to allow his BB to wash out Check serial trop (ordered, in process) Echo Zoll pads to be placed on patient  2. Hx by chart notes of AFib 3. PE On warfarin] [facility] IR from today 2.4 Will hold SCDs for DVT prophylaxis  4. By labs CKD III w/AKI Creat Jan 2022 was 1.5 (1.8 in March 2021) Likely 2/2 bradycardia Follow labs Hold his ACE  5. HTN Hydralazine, low dose Will allow some BP given CHB  6. BPH Home med   Risk Assessment/Risk Scores:    For questions or updates, please contact [facility] Please consult [email or web address] for contact info under     Signed, Sheilah Pigeon, PA-C  10/09/2021 11:55 AM    EP attending  Patient seen and examined.  Agree with the findings as noted above.  The patient is a pleasant 80 year old man with a history of hypertension and chronic renal insufficiency who has been on a beta-blocker for many years.  The patient presented with a week of feeling poorly, shortness of breath with exertion, and dizziness.  He has not had frank syncope.  He was found to have complete heart block with a ventricular escape of 32 bpm.  The patient has been on systemic anticoagulation and his INR is therapeutic.  He has been on metoprolol 50 mg daily.  He notes that he took his medications this morning.  He has not had recent chest pain but does get short of breath with exertion particularly in the last few days.  On presentation to the emergency room he was found to be in sinus rhythm with complete heart block and a ventricular rate of 32 bpm as noted above.  He is not volume overloaded.  Exam reveals a pleasant 80 year old man in no distress, looking younger than his stated age.  His lungs were clear.  The cardiovascular exam showed regular bradycardia.  Extremities were warm with no edema.  Neurologically was intact.  EKG is as previously noted.  The patient will be admitted to the hospital.  His beta-blocker will be held.  He will be maintained on telemetry.  No indication currently for a temporary pacemaker wire.  If his conduction returns within 36 hours then we will hold off on pacemaker insertion.  However if he does not have recurrent AV conduction then pacemaker implantation will be recommended, probably on Saturday, 48 hours after his last dose of metoprolol.  Lewayne Bunting, MD

## 2021-10-10 DIAGNOSIS — I442 Atrioventricular block, complete: Secondary | ICD-10-CM | POA: Diagnosis not present

## 2021-10-10 LAB — BASIC METABOLIC PANEL
Anion gap: 7 (ref 5–15)
BUN: 37 mg/dL — ABNORMAL HIGH (ref 8–23)
CO2: 21 mmol/L — ABNORMAL LOW (ref 22–32)
Calcium: 8.4 mg/dL — ABNORMAL LOW (ref 8.9–10.3)
Chloride: 107 mmol/L (ref 98–111)
Creatinine, Ser: 1.85 mg/dL — ABNORMAL HIGH (ref 0.61–1.24)
GFR, Estimated: 36 mL/min — ABNORMAL LOW (ref >60)
Glucose, Bld: 123 mg/dL — ABNORMAL HIGH (ref 70–99)
Potassium: 4.2 mmol/L (ref 3.5–5.1)
Sodium: 135 mmol/L (ref 135–145)

## 2021-10-10 LAB — PROTIME-INR
INR: 2.1 — ABNORMAL HIGH (ref 0.8–1.2)
Prothrombin Time: 23.2 seconds — ABNORMAL HIGH (ref 11.4–15.2)

## 2021-10-10 LAB — SURGICAL PCR SCREEN
MRSA, PCR: NEGATIVE
Staphylococcus aureus: NEGATIVE

## 2021-10-10 MED ORDER — SODIUM CHLORIDE 0.9 % IV SOLN: 250.0000 mL | INTRAVENOUS | Status: DC

## 2021-10-10 MED ORDER — SODIUM CHLORIDE 0.9% FLUSH
3.0000 mL | Freq: Two times a day (BID) | INTRAVENOUS | Status: DC
  Administered 2021-10-10: 3 mL via INTRAVENOUS

## 2021-10-10 MED ORDER — CHLORHEXIDINE GLUCONATE 4 % EX LIQD
60.0000 mL | Freq: Once | CUTANEOUS | Status: AC
  Administered 2021-10-11: 4 via TOPICAL

## 2021-10-10 MED ORDER — CEFAZOLIN SODIUM-DEXTROSE 2-4 GM/100ML-% IV SOLN
2.0000 g | INTRAVENOUS | Status: AC
Start: 2021-10-11 — End: 2021-10-11
  Administered 2021-10-11: 2 g via INTRAVENOUS

## 2021-10-10 MED ORDER — SODIUM CHLORIDE 0.9 % IV SOLN: INTRAVENOUS | Status: DC

## 2021-10-10 MED ORDER — SODIUM CHLORIDE 0.9% FLUSH: 3.0000 mL | INTRAVENOUS | Status: DC | PRN

## 2021-10-10 MED ORDER — SODIUM CHLORIDE 0.9 % IV SOLN
80.0000 mg | INTRAVENOUS | Status: AC
  Administered 2021-10-11: 80 mg

## 2021-10-10 MED ORDER — CEFDINIR 300 MG PO CAPS
300.0000 mg | ORAL_CAPSULE | Freq: Every day | ORAL | Status: DC
  Filled 2021-10-10 (×2): qty 1

## 2021-10-10 MED ORDER — CHLORHEXIDINE GLUCONATE 4 % EX LIQD
60.0000 mL | Freq: Once | CUTANEOUS | Status: AC
  Administered 2021-10-10: 1 via TOPICAL
  Filled 2021-10-10: qty 15

## 2021-10-10 NOTE — Progress Notes (Addendum)
Progress Note  Patient Name: Alexander Smith Date of Encounter: 10/10/2021  [facility] Cardiologist: None  Subjective   Feels OK, no rest SOB.    Inpatient Medications    Scheduled Meds:  doxycycline  100 mg Oral Q12H   finasteride  5 mg Oral q morning   hydrALAZINE  25 mg Oral BID   Continuous Infusions:  PRN Meds: acetaminophen, nitroGLYCERIN, ondansetron (ZOFRAN) IV   Vital Signs    Vitals:   10/09/21 2330 10/10/21 0400 10/10/21 0409 10/10/21 0624  BP: (!) 154/59  (!) 148/51   Pulse: (!) 42  (!) 40   Resp: 18  18   Temp: 98 F (36.7 C)  98.4 F (36.9 C)   TempSrc: Oral  Oral   SpO2: 92% 92% 94%   Weight:    85 kg  Height:        Intake/Output Summary (Last 24 hours) at 10/10/2021 0830 Last data filed at 10/10/2021 0411 Gross per 24 hour  Intake 100.11 ml  Output 850 ml  Net -749.89 ml   Last 3 Weights 10/10/2021 10/09/2021 03/12/2020  Weight (lbs) 187 lb 6.3 oz 180 lb 178 lb  Weight (kg) 85 kg 81.647 kg 80.74 kg      Telemetry    CHB 30's-40 - Personally Reviewed  ECG    No new EKGs - Personally Reviewed  Physical Exam   GEN: No acute distress.   Neck: No JVD Cardiac: RRR, bradycardic, no murmurs, rubs, or gallops.  Respiratory: CTA b/l. GI: Soft, nontender, non-distended  MS: No edema; No deformity. Neuro:  Nonfocal  Psych: Normal affect   Labs    High Sensitivity Troponin:   Recent Labs  Lab 10/09/21 1122 10/09/21 1322  TROPONINIHS 57* 64*     Chemistry Recent Labs  Lab 10/09/21 1122 10/09/21 1136 10/10/21 0215  NA 135 139 135  K 4.3 4.4 4.2  CL 105 107 107  CO2 20*  --  21*  GLUCOSE 115* 115* 123*  BUN 42* 42* 37*  CREATININE 1.97* 2.00* 1.85*  CALCIUM 8.8*  --  8.4*  MG 2.0  --   --   PROT 6.8  --   --   ALBUMIN 3.8  --   --   AST 40  --   --   ALT 77*  --   --   ALKPHOS 51  --   --   BILITOT 1.7*  --   --   GFRNONAA 33*  --  36*  ANIONGAP 10  --  7    Lipids No results for input(s): CHOL, TRIG, HDL,  LABVLDL, LDLCALC, CHOLHDL in the last 168 hours.  Hematology Recent Labs  Lab 10/09/21 1122 10/09/21 1136  WBC 10.8*  --   RBC 4.53  --   HGB 13.6 13.6  HCT 42.2 40.0  MCV 93.2  --   MCH 30.0  --   MCHC 32.2  --   RDW 13.2  --   PLT 197  --    Thyroid No results for input(s): TSH, FREET4 in the last 168 hours.  BNP Recent Labs  Lab 10/09/21 1127  BNP 741.4*    DDimer No results for input(s): DDIMER in the last 168 hours.   Radiology    DG Chest Portable 1 View Result Date: 10/09/2021 CLINICAL DATA:  Chest pain, dyspnea. EXAM: PORTABLE CHEST 1 VIEW COMPARISON:  March 12, 2020. FINDINGS: The heart size and mediastinal contours are within normal limits. Mild right basilar  atelectasis or infiltrate is noted. Minimal left basilar subsegmental atelectasis is noted. The visualized skeletal structures are unremarkable. IMPRESSION: Mild right basilar atelectasis or infiltrate is noted. Minimal left basilar subsegmental atelectasis. Electronically Signed   By: Lupita Raider M.D.   On: 10/09/2021 11:42      Cardiac Studies    10/09/21: TTE IMPRESSIONS   1. Left ventricular ejection fraction, by estimation, is 60 to 65%. The  left ventricle has normal function. The left ventricle has no regional  wall motion abnormalities. Left ventricular diastolic parameters are  indeterminate.   2. Right ventricular systolic function is normal. The right ventricular  size is normal. Tricuspid regurgitation signal is inadequate for assessing  PA pressure.   3. The mitral valve is degenerative. Mild mitral valve regurgitation. No  evidence of mitral stenosis.   4. The aortic valve is calcified. Aortic valve regurgitation is not  visualized. Mild aortic valve stenosis. Aortic valve area, by VTI measures  1.97 cm. Aortic valve mean gradient measures 16.0 mmHg. Aortic valve Vmax  measures 2.71 m/s.   5. Aortic dilatation noted. There is mild dilatation of the ascending  aorta, measuring 41 mm.    6. The inferior vena cava is normal in size with <50% respiratory  variability, suggesting right atrial pressure of 8 mmHg.    03/27/2008: TTE SUMMARY   -  Overall left ventricular systolic function was normal. Left         ventricular ejection fraction was estimated to be 60 %. There         were no left ventricular regional wall motion abnormalities.   -  Aortic valve thickness was mildly increased. There was lower         normal aortic valve leaflet excursion.   -  Normal mitral valve   Patient Profile     80 y.o. male PMHx of Afib, PE (on warfarin, monitored and managed with his PMD), HTN admitted with weakness, progressive DOE and CHB  Assessment & Plan    CHB This is likely the etiology of all of his symptoms  BP remains stable HR remains 30's-40 LVEF preserved Remains asymptomatic at rest  HS Trop minimally abnormal 57, 64, no CP, up until this week he had been very active, trimming trees and so on with excellent exertional capacty and no symptoms These are likely demand 2/2 CHB No WMA on his echo   2. Hx by chart notes of AFib 3. PE On warfarin] [facility] IR from yesterday 2.4  >> today 2.1 Warfarin on hold SCDs for DVT prophylaxis   4. By labs CKD III w/AKI Creat Jan 2022 was 1.5 (1.8 in March 2021) Likely 2/2 bradycardia Held his ACE Slightly better today 1.97 > 2.0 > 1.85 today   5. HTN Hydralazine, low dose Will allow some BP given CHB   6. BPH Home med  7. ?pneumonia ER MD had some concern of possible pneumonia on CXR On abx Afebrile WBC was wnl on admission No SOB at rest  Dr. Ladona Ridgel has seen the patient Will hold NPO after breakfast and see if we can pace him today given no improvement in conduction now nearly 24hours off BB  For questions or updates, please contact [facility] Please consult [email or web address] for contact info under     Signed, Sheilah Pigeon, PA-C  10/10/2021, 8:30 AM    EP Attending  Patient seen and examined.  Agree with the findings as noted above. The patient remains with CHB  and a slow VR. I have reviewed the indications/risks/benefits of PPM insertion. We will watch today and if his conduction does not come back plan for PPM tomorrow.   Sharlot Gowda Ameerah Huffstetler,MD

## 2021-10-10 NOTE — Discharge Instructions (Addendum)
Please do not restart your coumadin until Monday 10/13/21 to minimize bleeding complications after pacemaker placement.       Supplemental Discharge Instructions for  Pacemaker/Defibrillator Patients    Activity No heavy lifting or vigorous activity with your left/right arm for 6 to 8 weeks.  Do not raise your left/right arm above your head for one week.  Gradually raise your affected arm as drawn below.             10/16/21                      10/17/21                    10/18/21                   10/19/21 __  NO DRIVING for   1 week  ; you may begin driving on  28/3/15   .  WOUND CARE Keep the wound area clean and dry.  Do not get this area wet , no showers for one week; you may shower on  10/19/21   . The tape/steri-strips on your wound will fall off; do not pull them off.  No bandage is needed on the site.  DO  NOT apply any creams, oils, or ointments to the wound area. If you notice any drainage or discharge from the wound, any swelling or bruising at the site, or you develop a fever > 101? F after you are discharged home, call the office at once.  Special Instructions You are still able to use cellular telephones; use the ear opposite the side where you have your pacemaker/defibrillator.  Avoid carrying your cellular phone near your device. When traveling through airports, show security personnel your identification card to avoid being screened in the metal detectors.  Ask the security personnel to use the hand wand. Avoid arc welding equipment, MRI testing (magnetic resonance imaging), TENS units (transcutaneous nerve stimulators).  Call the office for questions about other devices. Avoid electrical appliances that are in poor condition or are not properly grounded. Microwave ovens are safe to be near or to operate.

## 2021-10-11 ENCOUNTER — Inpatient Hospital Stay (HOSPITAL_COMMUNITY): Payer: Medicare HMO

## 2021-10-11 ENCOUNTER — Encounter (HOSPITAL_COMMUNITY)
Admission: EM | Disposition: A | Discharge status: Home / Self Care | Payer: Self-pay | Source: Ambulatory Visit | Attending: Internal Medicine

## 2021-10-11 DIAGNOSIS — R001 Bradycardia, unspecified: Secondary | ICD-10-CM | POA: Diagnosis not present

## 2021-10-11 DIAGNOSIS — I442 Atrioventricular block, complete: Secondary | ICD-10-CM | POA: Diagnosis not present

## 2021-10-11 HISTORY — PX: PACEMAKER IMPLANT: EP1218

## 2021-10-11 SURGERY — PACEMAKER IMPLANT

## 2021-10-11 MED ORDER — LIDOCAINE HCL (PF) 1 % IJ SOLN
INTRAMUSCULAR | Status: AC
  Filled 2021-10-11: qty 60

## 2021-10-11 MED ORDER — HEPARIN (PORCINE) IN NACL 1000-0.9 UT/500ML-% IV SOLN
INTRAVENOUS | Status: AC
  Filled 2021-10-11: qty 500

## 2021-10-11 MED ORDER — FENTANYL CITRATE (PF) 100 MCG/2ML IJ SOLN
INTRAMUSCULAR | Status: DC | PRN
  Administered 2021-10-11: 12.5 ug via INTRAVENOUS

## 2021-10-11 MED ORDER — HEPARIN (PORCINE) IN NACL 1000-0.9 UT/500ML-% IV SOLN
INTRAVENOUS | Status: DC | PRN
  Administered 2021-10-11: 500 mL

## 2021-10-11 MED ORDER — ONDANSETRON HCL 4 MG/2ML IJ SOLN: 4.0000 mg | Freq: Four times a day (QID) | INTRAMUSCULAR | Status: DC | PRN

## 2021-10-11 MED ORDER — MIDAZOLAM HCL 5 MG/5ML IJ SOLN
INTRAMUSCULAR | Status: DC | PRN
  Administered 2021-10-11: 1 mg via INTRAVENOUS

## 2021-10-11 MED ORDER — SODIUM CHLORIDE 0.9 % IV SOLN
INTRAVENOUS | Status: AC
  Filled 2021-10-11: qty 2

## 2021-10-11 MED ORDER — CEFAZOLIN SODIUM-DEXTROSE 2-4 GM/100ML-% IV SOLN
INTRAVENOUS | Status: AC
  Filled 2021-10-11: qty 100

## 2021-10-11 MED ORDER — METOPROLOL SUCCINATE ER 50 MG PO TB24: 50.0000 mg | ORAL_TABLET | Freq: Every day | ORAL | 3 refills | Status: AC

## 2021-10-11 MED ORDER — HYDRALAZINE HCL 20 MG/ML IJ SOLN
20.0000 mg | INTRAMUSCULAR | Status: AC
  Administered 2021-10-11: 20 mg via INTRAVENOUS
  Filled 2021-10-11: qty 1

## 2021-10-11 MED ORDER — LIDOCAINE HCL (PF) 1 % IJ SOLN
INTRAMUSCULAR | Status: DC | PRN
  Administered 2021-10-11: 60 mL

## 2021-10-11 MED ORDER — ACETAMINOPHEN 325 MG PO TABS: 325.0000 mg | ORAL_TABLET | ORAL | Status: DC | PRN

## 2021-10-11 MED ORDER — MIDAZOLAM HCL 5 MG/5ML IJ SOLN
INTRAMUSCULAR | Status: AC
  Filled 2021-10-11: qty 5

## 2021-10-11 MED ORDER — FENTANYL CITRATE (PF) 100 MCG/2ML IJ SOLN
INTRAMUSCULAR | Status: AC
  Filled 2021-10-11: qty 2

## 2021-10-11 MED ORDER — CEFAZOLIN SODIUM-DEXTROSE 1-4 GM/50ML-% IV SOLN
1.0000 g | Freq: Once | INTRAVENOUS | Status: AC
  Administered 2021-10-11: 1 g via INTRAVENOUS
  Filled 2021-10-11: qty 50

## 2021-10-11 SURGICAL SUPPLY — 13 items
CABLE SURGICAL S-101-97-12 (CABLE) ×2 IMPLANT
CATH RIGHTSITE C315HIS02 (CATHETERS) ×2 IMPLANT
IPG PACE AZUR XT DR MRI W1DR01 (Pacemaker) ×1 IMPLANT
LEAD CAPSURE NOVUS 5076-52CM (Lead) ×2 IMPLANT
LEAD SELECT SECURE 3830 383069 (Lead) ×1 IMPLANT
MAT PREVALON FULL STRYKER (MISCELLANEOUS) ×2 IMPLANT
PACE AZURE XT DR MRI W1DR01 (Pacemaker) ×2 IMPLANT
PAD PRO RADIOLUCENT 2001M-C (PAD) ×2 IMPLANT
SELECT SECURE 3830 383069 (Lead) ×2 IMPLANT
SHEATH 7FR PRELUDE SNAP 13 (SHEATH) ×4 IMPLANT
SLITTER 6232ADJ (MISCELLANEOUS) ×2 IMPLANT
TRAY PACEMAKER INSERTION (PACKS) ×2 IMPLANT
WIRE HI TORQ VERSACORE-J 145CM (WIRE) ×2 IMPLANT

## 2021-10-11 NOTE — Progress Notes (Signed)
Progress Note  Patient Name: Alexander Smith Date of Encounter: 10/11/2021  Primary Cardiologist: None   Subjective   No chest pain or sob.   Inpatient Medications    Scheduled Meds:  cefdinir  300 mg Oral Daily   finasteride  5 mg Oral q morning   gentamicin irrigation  80 mg Irrigation To Cath   hydrALAZINE  25 mg Oral BID   sodium chloride flush  3 mL Intravenous Q12H   Continuous Infusions:  sodium chloride 50 mL/hr at 10/11/21 0603   sodium chloride      ceFAZolin (ANCEF) IV     PRN Meds: nitroGLYCERIN, ondansetron (ZOFRAN) IV, sodium chloride flush   Vital Signs    Vitals:   10/10/21 1713 10/10/21 2004 10/10/21 2356 10/11/21 0331  BP: (!) 154/57 (!) 189/57 (!) 170/59 (!) 170/53  Pulse: (!) 40 (!) 37 (!) 37 (!) 34  Resp: 18 18 19 19   Temp: 98.1 F (36.7 C) 98 F (36.7 C) 98.2 F (36.8 C) 98.2 F (36.8 C)  TempSrc: Oral Oral Oral Oral  SpO2: 95% 93% 94% 94%  Weight:      Height:        Intake/Output Summary (Last 24 hours) at 10/11/2021 0844 Last data filed at 10/11/2021 0544 Gross per 24 hour  Intake 480 ml  Output 950 ml  Net -470 ml   Filed Weights   10/09/21 1118 10/10/21 0624  Weight: 81.6 kg 85 kg    Telemetry    Nsr with CHB - Personally Reviewed  ECG    none - Personally Reviewed  Physical Exam   GEN: No acute distress.   Neck: 6 cm JVD Cardiac: Reg brady, no murmurs, rubs, or gallops.  Respiratory: Clear to auscultation bilaterally. GI: Soft, nontender, non-distended  MS: No edema; No deformity. Neuro:  Nonfocal  Psych: Normal affect   Labs    Chemistry Recent Labs  Lab 10/09/21 1122 10/09/21 1136 10/10/21 0215  NA 135 139 135  K 4.3 4.4 4.2  CL 105 107 107  CO2 20*  --  21*  GLUCOSE 115* 115* 123*  BUN 42* 42* 37*  CREATININE 1.97* 2.00* 1.85*  CALCIUM 8.8*  --  8.4*  PROT 6.8  --   --   ALBUMIN 3.8  --   --   AST 40  --   --   ALT 77*  --   --   ALKPHOS 51  --   --   BILITOT 1.7*  --   --   GFRNONAA  33*  --  36*  ANIONGAP 10  --  7     Hematology Recent Labs  Lab 10/09/21 1122 10/09/21 1136  WBC 10.8*  --   RBC 4.53  --   HGB 13.6 13.6  HCT 42.2 40.0  MCV 93.2  --   MCH 30.0  --   MCHC 32.2  --   RDW 13.2  --   PLT 197  --     Cardiac EnzymesNo results for input(s): TROPONINI in the last 168 hours. No results for input(s): TROPIPOC in the last 168 hours.   BNP Recent Labs  Lab 10/09/21 1127  BNP 741.4*     DDimer No results for input(s): DDIMER in the last 168 hours.   Radiology    DG Chest Portable 1 View  Result Date: 10/09/2021 CLINICAL DATA:  Chest pain, dyspnea. EXAM: PORTABLE CHEST 1 VIEW COMPARISON:  March 12, 2020. FINDINGS: The heart size and mediastinal  contours are within normal limits. Mild right basilar atelectasis or infiltrate is noted. Minimal left basilar subsegmental atelectasis is noted. The visualized skeletal structures are unremarkable. IMPRESSION: Mild right basilar atelectasis or infiltrate is noted. Minimal left basilar subsegmental atelectasis. Electronically Signed   By: Lupita Raider M.D.   On: 10/09/2021 11:42   ECHOCARDIOGRAM COMPLETE  Result Date: 10/09/2021    ECHOCARDIOGRAM REPORT   Patient Name:   [name] Date of Exam: 10/09/2021 Medical Rec #:  [id number]        Height:       69.0 in Accession #:    [id number]       Weight:       180.0 lb Date of Birth:  [birth year]        BSA:          1.976 m Patient Age:    80 years         BP:           183/60 mmHg Patient Gender: M                HR:           33 bpm. Exam Location:  Inpatient Procedure: 2D Echo, Cardiac Doppler and Color Doppler Indications:    Abnormal EKG  History:        Patient has no prior history of Echocardiogram examinations.                 Arrythmias:Heart block.  Sonographer:    Cleatis Polka Referring Phys: [id number] RENEE LYNN URSUY IMPRESSIONS  1. Left ventricular ejection fraction, by estimation, is 60 to 65%. The left ventricle has normal function. The left  ventricle has no regional wall motion abnormalities. Left ventricular diastolic parameters are indeterminate.  2. Right ventricular systolic function is normal. The right ventricular size is normal. Tricuspid regurgitation signal is inadequate for assessing PA pressure.  3. The mitral valve is degenerative. Mild mitral valve regurgitation. No evidence of mitral stenosis.  4. The aortic valve is calcified. Aortic valve regurgitation is not visualized. Mild aortic valve stenosis. Aortic valve area, by VTI measures 1.97 cm. Aortic valve mean gradient measures 16.0 mmHg. Aortic valve Vmax measures 2.71 m/s.  5. Aortic dilatation noted. There is mild dilatation of the ascending aorta, measuring 41 mm.  6. The inferior vena cava is normal in size with <50% respiratory variability, suggesting right atrial pressure of 8 mmHg. FINDINGS  Left Ventricle: Left ventricular ejection fraction, by estimation, is 60 to 65%. The left ventricle has normal function. The left ventricle has no regional wall motion abnormalities. The left ventricular internal cavity size was normal in size. There is  no left ventricular hypertrophy. Left ventricular diastolic parameters are indeterminate. Normal left ventricular filling pressure. Right Ventricle: The right ventricular size is normal. No increase in right ventricular wall thickness. Right ventricular systolic function is normal. Tricuspid regurgitation signal is inadequate for assessing PA pressure. Left Atrium: Left atrial size was normal in size. Right Atrium: Right atrial size was normal in size. Pericardium: There is no evidence of pericardial effusion. Mitral Valve: The mitral valve is degenerative in appearance. There is mild calcification of the anterior mitral valve leaflet(s). Mild to moderate mitral annular calcification. Mild mitral valve regurgitation. No evidence of mitral valve stenosis. Tricuspid Valve: The tricuspid valve is normal in structure. Tricuspid valve  regurgitation is trivial. No evidence of tricuspid stenosis. Aortic Valve: The aortic valve is calcified. Aortic valve regurgitation is not  visualized. Mild aortic stenosis is present. Aortic valve mean gradient measures 16.0 mmHg. Aortic valve peak gradient measures 29.4 mmHg. Aortic valve area, by VTI measures 1.97 cm. Pulmonic Valve: The pulmonic valve was normal in structure. Pulmonic valve regurgitation is trivial. No evidence of pulmonic stenosis. Aorta: Aortic dilatation noted. There is mild dilatation of the ascending aorta, measuring 41 mm. Venous: The inferior vena cava is normal in size with less than 50% respiratory variability, suggesting right atrial pressure of 8 mmHg. IAS/Shunts: The interatrial septum appears to be lipomatous. No atrial level shunt detected by color flow Doppler.  LEFT VENTRICLE PLAX 2D LVIDd:         5.30 cm      Diastology LVIDs:         2.80 cm      LV e' medial:    10.80 cm/s LV PW:         1.10 cm      LV E/e' medial:  14.1 LV IVS:        1.00 cm      LV e' lateral:   12.90 cm/s LVOT diam:     2.10 cm      LV E/e' lateral: 11.8 LV SV:         121 LV SV Index:   61 LVOT Area:     3.46 cm  LV Volumes (MOD) LV vol d, MOD A2C: 111.0 ml LV vol d, MOD A4C: 124.0 ml LV vol s, MOD A2C: 44.0 ml LV vol s, MOD A4C: 52.5 ml LV SV MOD A2C:     67.0 ml LV SV MOD A4C:     124.0 ml LV SV MOD BP:      69.5 ml RIGHT VENTRICLE             IVC RV Basal diam:  3.90 cm     IVC diam: 2.00 cm RV S prime:     15.50 cm/s TAPSE (M-mode): 3.0 cm LEFT ATRIUM             Index        RIGHT ATRIUM           Index LA diam:        4.30 cm 2.18 cm/m   RA Area:     16.10 cm LA Vol (A2C):   60.2 ml 30.47 ml/m  RA Volume:   40.50 ml  20.50 ml/m LA Vol (A4C):   50.0 ml 25.31 ml/m LA Biplane Vol: 54.9 ml 27.79 ml/m  AORTIC VALVE AV Area (Vmax):    1.95 cm AV Area (Vmean):   2.09 cm AV Area (VTI):     1.97 cm AV Vmax:           271.25 cm/s AV Vmean:          192.000 cm/s AV VTI:            0.612 m AV Peak  Grad:      29.4 mmHg AV Mean Grad:      16.0 mmHg LVOT Vmax:         152.50 cm/s LVOT Vmean:        116.000 cm/s LVOT VTI:          0.348 m LVOT/AV VTI ratio: 0.57  AORTA Ao Root diam: 3.30 cm Ao Asc diam:  4.10 cm MITRAL VALVE MV Area (PHT): 4.04 cm     SHUNTS MV Decel Time: 188 msec     Systemic VTI:  0.35  m MV E velocity: 152.00 cm/s  Systemic Diam: 2.10 cm MV A velocity: 115.00 cm/s MV E/A ratio:  1.32 Armanda Magic MD Electronically signed by Armanda Magic MD Signature Date/Time: 10/09/2021/3:38:12 PM    Final     Cardiac Studies   2D echo reviewed  Patient Profile     80 y.o. male admitted with CHB on metoprolol.  Assessment & Plan    CHB - he has been off of metoprolol for 48 hours with no recurrent AV conduction and will undergo PM insertion this morning. I have discussed the indications/risks/benefits/goals/expectations of PPM insertion and he wishes to proceed. HTN - once his PPM is in place we will restart his beta blocker.  For questions or updates, please contact [facility] Please consult [email or web address] for contact info under Cardiology/STEMI.   Signed, Lewayne Bunting, MD  10/11/2021, 8:44 AM

## 2021-10-11 NOTE — Progress Notes (Signed)
Pt being d/c, VSS, education complete, Iv removed.  Prior to D/C pt Bp sys over 200, paged MD, medication orders were completed and BP stabilized prior to leaving.   Kalman Jewels, RN 10/11/2021 6:00 PM

## 2021-10-11 NOTE — Progress Notes (Signed)
Pt back from procedure, VSS, call light within reach will continue to monitor.  Kalman Jewels, RN 10/11/2021 10:31 AM

## 2021-10-11 NOTE — Progress Notes (Signed)
Pt pending d/c based on chest x ray results, no orders in, paged PA.   Kalman Jewels, RN 10/11/2021 4:12 PM

## 2021-10-11 NOTE — Discharge Summary (Addendum)
Discharge Summary    Patient ID: Alexander Smith MRN: [id number]; DOB: [birth year]  Admit date: 10/09/2021 Discharge date: 10/11/2021  PCP:  Vivien Presto, MD   [facility] Providers Cardiologist:  Dr. Ladona Ridgel     Discharge Diagnoses    Active Problems:   Complete heart block (HCC)   Elevated troponin  Atrial fibrillation   Acute on CKD IIIa HTN Hx of PE Atrial fibrillation   Diagnostic Studies/Procedures    10/09/21: TTE IMPRESSIONS   1. Left ventricular ejection fraction, by estimation, is 60 to 65%. The  left ventricle has normal function. The left ventricle has no regional  wall motion abnormalities. Left ventricular diastolic parameters are  indeterminate.   2. Right ventricular systolic function is normal. The right ventricular  size is normal. Tricuspid regurgitation signal is inadequate for assessing  PA pressure.   3. The mitral valve is degenerative. Mild mitral valve regurgitation. No  evidence of mitral stenosis.   4. The aortic valve is calcified. Aortic valve regurgitation is not  visualized. Mild aortic valve stenosis. Aortic valve area, by VTI measures  1.97 cm. Aortic valve mean gradient measures 16.0 mmHg. Aortic valve Vmax  measures 2.71 m/s.   5. Aortic dilatation noted. There is mild dilatation of the ascending  aorta, measuring 41 mm.   6. The inferior vena cava is normal in size with <50% respiratory  variability, suggesting right atrial pressure of 8 mmHg.    History of Present Illness     Alexander Smith is a 80 y.o. male with PMHx of Afib, PE (on warfarin, monitored and managed with his PMD), HTN send from clinic for CHB.   Alexander Smith was at the clinic for his INR check 10/09/21. He mentioned some SOB and CP. EKG was done and found CHB and EMS was called, he was brought to [facility].    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  Suddenly for one week he can not hardly walk across the room without feeling SOB and heavy in his chest. No palpitations He has not had any near syncope or syncope this week or of late, though perhaps a year ago he fainted and was told 2/2 dehydration.  Hospital Course     Consultants: None   CHB He was admitted and held his Lopressor 50mg  daily. Echo showed preserved LVEF. His HR remained in 30-40s with stable blood pressure. 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 No immediate complications. Chest x-ray without pneumothorax . Device function normal.   2. Elevated troponin  Hs-troponin was flat at 57>>64. This was felt demand 2nd to CHF. He had no chest pain while being active.   3. Acute on CKD III - His ACE  was held 2nd to elevated scr.  - Scr 1.97 > 2.0 > 1.85 today  4. HTN - ACE and BB held as above - On hydralazine  - Resumed Toprol XL 50mg  qd at discharge after PPM   Did the patient have an acute coronary syndrome (MI, NSTEMI, STEMI, etc) this admission?:  No                               Did the patient have a percutaneous coronary intervention (stent / angioplasty)?:  No.  _____________  Discharge Vitals Blood pressure (!) 152/66, pulse 87, temperature 97.9 F (36.6 C), temperature source Oral, resp. rate 15, height  (1.753 m), weight 85 kg, SpO2 90 %.  Filed Weights   10/09/21 1118 10/10/21 0624  Weight: 81.6 kg 85 kg    Labs & Radiologic Studies    CBC Recent Labs    10/09/21 1122 10/09/21 1136  WBC 10.8*  --   NEUTROABS 8.0*  --   HGB 13.6 13.6  HCT 42.2 40.0  MCV 93.2  --   PLT 197  --    Basic Metabolic Panel Recent Labs    29/56/21 1122 10/09/21 1136 10/10/21 0215  NA 135 139 135  K 4.3 4.4 4.2  CL 105 107 107  CO2 20*  --  21*  GLUCOSE 115* 115* 123*  BUN 42* 42* 37*  CREATININE 1.97* 2.00* 1.85*  CALCIUM 8.8*  --  8.4*  MG 2.0   --   --    Liver Function Tests Recent Labs    10/09/21 1122  AST 40  ALT 77*  ALKPHOS 51  BILITOT 1.7*  PROT 6.8  ALBUMIN 3.8   No results for input(s): LIPASE, AMYLASE in the last 72 hours. High Sensitivity Troponin:   Recent Labs  Lab 10/09/21 1122 10/09/21 1322  TROPONINIHS 57* 64*    BNP Invalid input(s): POCBNP D-Dimer No results for input(s): DDIMER in the last 72 hours. Hemoglobin A1C No results for input(s): HGBA1C in the last 72 hours. Fasting Lipid Panel No results for input(s): CHOL, HDL, LDLCALC, TRIG, CHOLHDL, LDLDIRECT in the last 72 hours. Thyroid Function Tests No results for input(s): TSH, T4TOTAL, T3FREE, THYROIDAB in the last 72 hours.  Invalid input(s): FREET3 _____________  DG Chest 2 View  Result Date: 10/11/2021 CLINICAL DATA:  Status post pacemaker placement. EXAM: CHEST - 2 VIEW COMPARISON:  October 09, 2021. FINDINGS: Stable cardiomediastinal silhouette. Interval placement of left-sided pacemaker with leads in grossly good position. No pneumothorax is noted. Mild bibasilar subsegmental atelectasis is noted with small pleural effusions. Bony thorax is unremarkable. IMPRESSION: Interval placement of left-sided pacemaker with leads in grossly good position. No pneumothorax is noted. Small bilateral pleural effusions are noted. Electronically Signed   By: Lupita Raider M.D.   On: 10/11/2021 17:10   EP PPM/ICD IMPLANT  Result Date: 10/11/2021 CONCLUSIONS:  1. 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  2. No early apparent complications.       Lewayne Bunting, MD 10/11/2021 10:13 AM   DG Chest Portable 1 View  Result Date: 10/09/2021 CLINICAL DATA:  Chest pain, dyspnea. EXAM: PORTABLE CHEST 1 VIEW COMPARISON:  March 12, 2020. FINDINGS: The heart size and mediastinal contours are within normal limits. Mild right basilar atelectasis or infiltrate is noted. Minimal left basilar  subsegmental atelectasis is noted. The visualized skeletal structures are unremarkable. IMPRESSION: Mild right basilar atelectasis or infiltrate is noted. Minimal left basilar subsegmental atelectasis. Electronically Signed   By: Lupita Raider M.D.   On: 10/09/2021 11:42   ECHOCARDIOGRAM COMPLETE  Result Date: 10/09/2021    ECHOCARDIOGRAM REPORT   Patient Name:   AKAI DOLLARD Date of Exam: 10/09/2021 Medical Rec #:  [id number]        Height:       69.0 in Accession #:    [id number]       Weight:       180.0 lb Date of Birth:  06/05/1941  BSA:          1.976 m Patient Age:    80 years         BP:           183/60 mmHg Patient Gender: M                HR:           33 bpm. Exam Location:  Inpatient Procedure: 2D Echo, Cardiac Doppler and Color Doppler Indications:    Abnormal EKG  History:        Patient has no prior history of Echocardiogram examinations.                 Arrythmias:Heart block.  Sonographer:    Cleatis Polka Referring Phys: [id number] RENEE LYNN URSUY IMPRESSIONS  1. Left ventricular ejection fraction, by estimation, is 60 to 65%. The left ventricle has normal function. The left ventricle has no regional wall motion abnormalities. Left ventricular diastolic parameters are indeterminate.  2. Right ventricular systolic function is normal. The right ventricular size is normal. Tricuspid regurgitation signal is inadequate for assessing PA pressure.  3. The mitral valve is degenerative. Mild mitral valve regurgitation. No evidence of mitral stenosis.  4. The aortic valve is calcified. Aortic valve regurgitation is not visualized. Mild aortic valve stenosis. Aortic valve area, by VTI measures 1.97 cm. Aortic valve mean gradient measures 16.0 mmHg. Aortic valve Vmax measures 2.71 m/s.  5. Aortic dilatation noted. There is mild dilatation of the ascending aorta, measuring 41 mm.  6. The inferior vena cava is normal in size with <50% respiratory variability, suggesting right atrial pressure of 8  mmHg. FINDINGS  Left Ventricle: Left ventricular ejection fraction, by estimation, is 60 to 65%. The left ventricle has normal function. The left ventricle has no regional wall motion abnormalities. The left ventricular internal cavity size was normal in size. There is  no left ventricular hypertrophy. Left ventricular diastolic parameters are indeterminate. Normal left ventricular filling pressure. Right Ventricle: The right ventricular size is normal. No increase in right ventricular wall thickness. Right ventricular systolic function is normal. Tricuspid regurgitation signal is inadequate for assessing PA pressure. Left Atrium: Left atrial size was normal in size. Right Atrium: Right atrial size was normal in size. Pericardium: There is no evidence of pericardial effusion. Mitral Valve: The mitral valve is degenerative in appearance. There is mild calcification of the anterior mitral valve leaflet(s). Mild to moderate mitral annular calcification. Mild mitral valve regurgitation. No evidence of mitral valve stenosis. Tricuspid Valve: The tricuspid valve is normal in structure. Tricuspid valve regurgitation is trivial. No evidence of tricuspid stenosis. Aortic Valve: The aortic valve is calcified. Aortic valve regurgitation is not visualized. Mild aortic stenosis is present. Aortic valve mean gradient measures 16.0 mmHg. Aortic valve peak gradient measures 29.4 mmHg. Aortic valve area, by VTI measures 1.97 cm. Pulmonic Valve: The pulmonic valve was normal in structure. Pulmonic valve regurgitation is trivial. No evidence of pulmonic stenosis. Aorta: Aortic dilatation noted. There is mild dilatation of the ascending aorta, measuring 41 mm. Venous: The inferior vena cava is normal in size with less than 50% respiratory variability, suggesting right atrial pressure of 8 mmHg. IAS/Shunts: The interatrial septum appears to be lipomatous. No atrial level shunt detected by color flow Doppler.  LEFT VENTRICLE PLAX 2D  LVIDd:         5.30 cm      Diastology LVIDs:         2.80 cm  LV e' medial:    10.80 cm/s LV PW:         1.10 cm      LV E/e' medial:  14.1 LV IVS:        1.00 cm      LV e' lateral:   12.90 cm/s LVOT diam:     2.10 cm      LV E/e' lateral: 11.8 LV SV:         121 LV SV Index:   61 LVOT Area:     3.46 cm  LV Volumes (MOD) LV vol d, MOD A2C: 111.0 ml LV vol d, MOD A4C: 124.0 ml LV vol s, MOD A2C: 44.0 ml LV vol s, MOD A4C: 52.5 ml LV SV MOD A2C:     67.0 ml LV SV MOD A4C:     124.0 ml LV SV MOD BP:      69.5 ml RIGHT VENTRICLE             IVC RV Basal diam:  3.90 cm     IVC diam: 2.00 cm RV S prime:     15.50 cm/s TAPSE (M-mode): 3.0 cm LEFT ATRIUM             Index        RIGHT ATRIUM           Index LA diam:        4.30 cm 2.18 cm/m   RA Area:     16.10 cm LA Vol (A2C):   60.2 ml 30.47 ml/m  RA Volume:   40.50 ml  20.50 ml/m LA Vol (A4C):   50.0 ml 25.31 ml/m LA Biplane Vol: 54.9 ml 27.79 ml/m  AORTIC VALVE AV Area (Vmax):    1.95 cm AV Area (Vmean):   2.09 cm AV Area (VTI):     1.97 cm AV Vmax:           271.25 cm/s AV Vmean:          192.000 cm/s AV VTI:            0.612 m AV Peak Grad:      29.4 mmHg AV Mean Grad:      16.0 mmHg LVOT Vmax:         152.50 cm/s LVOT Vmean:        116.000 cm/s LVOT VTI:          0.348 m LVOT/AV VTI ratio: 0.57  AORTA Ao Root diam: 3.30 cm Ao Asc diam:  4.10 cm MITRAL VALVE MV Area (PHT): 4.04 cm     SHUNTS MV Decel Time: 188 msec     Systemic VTI:  0.35 m MV E velocity: 152.00 cm/s  Systemic Diam: 2.10 cm MV A velocity: 115.00 cm/s MV E/A ratio:  1.32 Armanda Magic MD Electronically signed by Armanda Magic MD Signature Date/Time: 10/09/2021/3:38:12 PM    Final    Disposition   Pt is being discharged home today in good condition.  Follow-up Plans & Appointments     Follow-up Information     [facility] Follow up.   Specialty: Cardiology Why: 10/23/21 @ 4:00PM, wound check visit Contact information: [address], [address] [place] [address] [phone]        Marinus Maw, MD Follow up.   Specialty: Cardiology Why: 01/15/22 @ 2:45PM Contact information: [address] [place] [address] [phone]  Discharge Medications   Allergies as of 10/11/2021       Reactions   Aspirin Other (See Comments)   Due to Coumadin        Medication List     STOP taking these medications    metoprolol tartrate 50 MG tablet Commonly known as: LOPRESSOR       TAKE these medications    finasteride 5 MG tablet Commonly known as: PROSCAR Take 5 mg by mouth every morning.   hydrochlorothiazide 25 MG tablet Commonly known as: HYDRODIURIL Take 25 mg by mouth daily.   lisinopril 20 MG tablet Commonly known as: ZESTRIL Take 20 mg by mouth daily.   metoprolol succinate 50 MG 24 hr tablet Commonly known as: Toprol XL Take 1 tablet (50 mg total) by mouth daily. Take with or immediately following a meal.   warfarin 5 MG tablet Commonly known as: COUMADIN Take 5 mg by mouth See admin instructions. 5mg  daily on Monday and Friday Notes to patient: Restart 10/13/21 as scheduled   warfarin 2.5 MG tablet Commonly known as: COUMADIN Take 2.5 mg by mouth See admin instructions. 2.5mg  daily on Tuesday Wednesday Thursday Saturday Sunday Notes to patient: Restart 10/13/21 as scheduled         Outstanding Labs/Studies   None  Duration of Discharge Encounter   Greater than 30 minutes including physician time.  Signed, 10/15/21, PA-C 10/11/2021, 5:34 PM  EP Attending  Patient seen and examined. Agree with above. The patient is doing well and is stable for DC. His PPM has been interrogated under my direction and is working normally. Ok for DC home with usual followup.   10/13/2021 Patrena Santalucia,MD

## 2021-10-11 NOTE — Interval H&P Note (Signed)
History and Physical Interval Note:  10/11/2021 9:08 AM  Alexander Smith  has presented today for surgery, with the diagnosis of chest pain.  The various methods of treatment have been discussed with the patient and family. After consideration of risks, benefits and other options for treatment, the patient has consented to  Procedure(s): PACEMAKER IMPLANT (N/A) as a surgical intervention.  The patient's history has been reviewed, patient examined, no change in status, stable for surgery.  I have reviewed the patient's chart and labs.  Questions were answered to the patient's satisfaction.     Lewayne Bunting

## 2021-10-11 NOTE — H&P (View-Only) (Signed)
Progress Note  Patient Name: Alexander Smith Date of Encounter: 10/11/2021  Primary Cardiologist: None   Subjective   No chest pain or sob.   Inpatient Medications    Scheduled Meds:  cefdinir  300 mg Oral Daily   finasteride  5 mg Oral q morning   gentamicin irrigation  80 mg Irrigation To Cath   hydrALAZINE  25 mg Oral BID   sodium chloride flush  3 mL Intravenous Q12H   Continuous Infusions:  sodium chloride 50 mL/hr at 10/11/21 0603   sodium chloride      ceFAZolin (ANCEF) IV     PRN Meds: nitroGLYCERIN, ondansetron (ZOFRAN) IV, sodium chloride flush   Vital Signs    Vitals:   10/10/21 1713 10/10/21 2004 10/10/21 2356 10/11/21 0331  BP: (!) 154/57 (!) 189/57 (!) 170/59 (!) 170/53  Pulse: (!) 40 (!) 37 (!) 37 (!) 34  Resp: 18 18 19 19   Temp: 98.1 F (36.7 C) 98 F (36.7 C) 98.2 F (36.8 C) 98.2 F (36.8 C)  TempSrc: Oral Oral Oral Oral  SpO2: 95% 93% 94% 94%  Weight:      Height:        Intake/Output Summary (Last 24 hours) at 10/11/2021 0844 Last data filed at 10/11/2021 0544 Gross per 24 hour  Intake 480 ml  Output 950 ml  Net -470 ml   Filed Weights   10/09/21 1118 10/10/21 0624  Weight: 81.6 kg 85 kg    Telemetry    Nsr with CHB - Personally Reviewed  ECG    none - Personally Reviewed  Physical Exam   GEN: No acute distress.   Neck: 6 cm JVD Cardiac: Reg brady, no murmurs, rubs, or gallops.  Respiratory: Clear to auscultation bilaterally. GI: Soft, nontender, non-distended  MS: No edema; No deformity. Neuro:  Nonfocal  Psych: Normal affect   Labs    Chemistry Recent Labs  Lab 10/09/21 1122 10/09/21 1136 10/10/21 0215  NA 135 139 135  K 4.3 4.4 4.2  CL 105 107 107  CO2 20*  --  21*  GLUCOSE 115* 115* 123*  BUN 42* 42* 37*  CREATININE 1.97* 2.00* 1.85*  CALCIUM 8.8*  --  8.4*  PROT 6.8  --   --   ALBUMIN 3.8  --   --   AST 40  --   --   ALT 77*  --   --   ALKPHOS 51  --   --   BILITOT 1.7*  --   --   GFRNONAA  33*  --  36*  ANIONGAP 10  --  7     Hematology Recent Labs  Lab 10/09/21 1122 10/09/21 1136  WBC 10.8*  --   RBC 4.53  --   HGB 13.6 13.6  HCT 42.2 40.0  MCV 93.2  --   MCH 30.0  --   MCHC 32.2  --   RDW 13.2  --   PLT 197  --     Cardiac EnzymesNo results for input(s): TROPONINI in the last 168 hours. No results for input(s): TROPIPOC in the last 168 hours.   BNP Recent Labs  Lab 10/09/21 1127  BNP 741.4*     DDimer No results for input(s): DDIMER in the last 168 hours.   Radiology    DG Chest Portable 1 View  Result Date: 10/09/2021 CLINICAL DATA:  Chest pain, dyspnea. EXAM: PORTABLE CHEST 1 VIEW COMPARISON:  March 12, 2020. FINDINGS: The heart size and mediastinal  contours are within normal limits. Mild right basilar atelectasis or infiltrate is noted. Minimal left basilar subsegmental atelectasis is noted. The visualized skeletal structures are unremarkable. IMPRESSION: Mild right basilar atelectasis or infiltrate is noted. Minimal left basilar subsegmental atelectasis. Electronically Signed   By: Lupita Raider M.D.   On: 10/09/2021 11:42   ECHOCARDIOGRAM COMPLETE  Result Date: 10/09/2021    ECHOCARDIOGRAM REPORT   Patient Name:   Alexander Smith Date of Exam: 10/09/2021 Medical Rec #:  [id number]        Height:       69.0 in Accession #:    [id number]       Weight:       180.0 lb Date of Birth:  [birth year]        BSA:          1.976 m Patient Age:    80 years         BP:           183/60 mmHg Patient Gender: M                HR:           33 bpm. Exam Location:  Inpatient Procedure: 2D Echo, Cardiac Doppler and Color Doppler Indications:    Abnormal EKG  History:        Patient has no prior history of Echocardiogram examinations.                 Arrythmias:Heart block.  Sonographer:    Cleatis Polka Referring Phys: [id number] RENEE LYNN URSUY IMPRESSIONS  1. Left ventricular ejection fraction, by estimation, is 60 to 65%. The left ventricle has normal function. The left  ventricle has no regional wall motion abnormalities. Left ventricular diastolic parameters are indeterminate.  2. Right ventricular systolic function is normal. The right ventricular size is normal. Tricuspid regurgitation signal is inadequate for assessing PA pressure.  3. The mitral valve is degenerative. Mild mitral valve regurgitation. No evidence of mitral stenosis.  4. The aortic valve is calcified. Aortic valve regurgitation is not visualized. Mild aortic valve stenosis. Aortic valve area, by VTI measures 1.97 cm. Aortic valve mean gradient measures 16.0 mmHg. Aortic valve Vmax measures 2.71 m/s.  5. Aortic dilatation noted. There is mild dilatation of the ascending aorta, measuring 41 mm.  6. The inferior vena cava is normal in size with <50% respiratory variability, suggesting right atrial pressure of 8 mmHg. FINDINGS  Left Ventricle: Left ventricular ejection fraction, by estimation, is 60 to 65%. The left ventricle has normal function. The left ventricle has no regional wall motion abnormalities. The left ventricular internal cavity size was normal in size. There is  no left ventricular hypertrophy. Left ventricular diastolic parameters are indeterminate. Normal left ventricular filling pressure. Right Ventricle: The right ventricular size is normal. No increase in right ventricular wall thickness. Right ventricular systolic function is normal. Tricuspid regurgitation signal is inadequate for assessing PA pressure. Left Atrium: Left atrial size was normal in size. Right Atrium: Right atrial size was normal in size. Pericardium: There is no evidence of pericardial effusion. Mitral Valve: The mitral valve is degenerative in appearance. There is mild calcification of the anterior mitral valve leaflet(s). Mild to moderate mitral annular calcification. Mild mitral valve regurgitation. No evidence of mitral valve stenosis. Tricuspid Valve: The tricuspid valve is normal in structure. Tricuspid valve  regurgitation is trivial. No evidence of tricuspid stenosis. Aortic Valve: The aortic valve is calcified. Aortic valve regurgitation is not  visualized. Mild aortic stenosis is present. Aortic valve mean gradient measures 16.0 mmHg. Aortic valve peak gradient measures 29.4 mmHg. Aortic valve area, by VTI measures 1.97 cm. Pulmonic Valve: The pulmonic valve was normal in structure. Pulmonic valve regurgitation is trivial. No evidence of pulmonic stenosis. Aorta: Aortic dilatation noted. There is mild dilatation of the ascending aorta, measuring 41 mm. Venous: The inferior vena cava is normal in size with less than 50% respiratory variability, suggesting right atrial pressure of 8 mmHg. IAS/Shunts: The interatrial septum appears to be lipomatous. No atrial level shunt detected by color flow Doppler.  LEFT VENTRICLE PLAX 2D LVIDd:         5.30 cm      Diastology LVIDs:         2.80 cm      LV e' medial:    10.80 cm/s LV PW:         1.10 cm      LV E/e' medial:  14.1 LV IVS:        1.00 cm      LV e' lateral:   12.90 cm/s LVOT diam:     2.10 cm      LV E/e' lateral: 11.8 LV SV:         121 LV SV Index:   61 LVOT Area:     3.46 cm  LV Volumes (MOD) LV vol d, MOD A2C: 111.0 ml LV vol d, MOD A4C: 124.0 ml LV vol s, MOD A2C: 44.0 ml LV vol s, MOD A4C: 52.5 ml LV SV MOD A2C:     67.0 ml LV SV MOD A4C:     124.0 ml LV SV MOD BP:      69.5 ml RIGHT VENTRICLE             IVC RV Basal diam:  3.90 cm     IVC diam: 2.00 cm RV S prime:     15.50 cm/s TAPSE (M-mode): 3.0 cm LEFT ATRIUM             Index        RIGHT ATRIUM           Index LA diam:        4.30 cm 2.18 cm/m   RA Area:     16.10 cm LA Vol (A2C):   60.2 ml 30.47 ml/m  RA Volume:   40.50 ml  20.50 ml/m LA Vol (A4C):   50.0 ml 25.31 ml/m LA Biplane Vol: 54.9 ml 27.79 ml/m  AORTIC VALVE AV Area (Vmax):    1.95 cm AV Area (Vmean):   2.09 cm AV Area (VTI):     1.97 cm AV Vmax:           271.25 cm/s AV Vmean:          192.000 cm/s AV VTI:            0.612 m AV Peak  Grad:      29.4 mmHg AV Mean Grad:      16.0 mmHg LVOT Vmax:         152.50 cm/s LVOT Vmean:        116.000 cm/s LVOT VTI:          0.348 m LVOT/AV VTI ratio: 0.57  AORTA Ao Root diam: 3.30 cm Ao Asc diam:  4.10 cm MITRAL VALVE MV Area (PHT): 4.04 cm     SHUNTS MV Decel Time: 188 msec     Systemic VTI:  0.35   m MV E velocity: 152.00 cm/s  Systemic Diam: 2.10 cm MV A velocity: 115.00 cm/s MV E/A ratio:  1.32 Armanda Magic MD Electronically signed by Armanda Magic MD Signature Date/Time: 10/09/2021/3:38:12 PM    Final     Cardiac Studies   2D echo reviewed  Patient Profile     80 y.o. male admitted with CHB on metoprolol.  Assessment & Plan    CHB - he has been off of metoprolol for 48 hours with no recurrent AV conduction and will undergo PM insertion this morning. I have discussed the indications/risks/benefits/goals/expectations of PPM insertion and he wishes to proceed. HTN - once his PPM is in place we will restart his beta blocker.  For questions or updates, please contact [facility] Please consult [email or web address] for contact info under Cardiology/STEMI.   Signed, Lewayne Bunting, MD  10/11/2021, 8:44 AM

## 2021-10-11 NOTE — Progress Notes (Signed)
Progress Note  Patient Name: Alexander Smith Date of Encounter: 10/11/2021  Primary Cardiologist: None   Subjective   No chest pain. Feels better after PPM earlier today.   Inpatient Medications    Scheduled Meds:  cefdinir  300 mg Oral Daily   finasteride  5 mg Oral q morning   hydrALAZINE  25 mg Oral BID   sodium chloride flush  3 mL Intravenous Q12H   Continuous Infusions:   ceFAZolin (ANCEF) IV     PRN Meds: acetaminophen, nitroGLYCERIN, ondansetron (ZOFRAN) IV   Vital Signs    Vitals:   10/11/21 1003 10/11/21 1008 10/11/21 1009 10/11/21 1033  BP: (!) 183/100  (!) 186/95 (!) 183/94  Pulse: 89 81 80 84  Resp: 15 15 15 14   Temp:    98.1 F (36.7 C)  TempSrc:    Oral  SpO2: 98% 99% 99% 95%  Weight:      Height:        Intake/Output Summary (Last 24 hours) at 10/11/2021 1148 Last data filed at 10/11/2021 0544 Gross per 24 hour  Intake 480 ml  Output 950 ml  Net -470 ml   Filed Weights   10/09/21 1118 10/10/21 0624  Weight: 81.6 kg 85 kg    Telemetry    NSR with ventricular pacing - Personally Reviewed  ECG    none - Personally Reviewed  Physical Exam   GEN: No acute distress.   Neck: No JVD Cardiac: RRR, no murmurs, rubs, or gallops.  Respiratory: Clear to auscultation bilaterally. No hematoma. GI: Soft, nontender, non-distended  MS: No edema; No deformity. Neuro:  Nonfocal  Psych: Normal affect   Labs    Chemistry Recent Labs  Lab 10/09/21 1122 10/09/21 1136 10/10/21 0215  NA 135 139 135  K 4.3 4.4 4.2  CL 105 107 107  CO2 20*  --  21*  GLUCOSE 115* 115* 123*  BUN 42* 42* 37*  CREATININE 1.97* 2.00* 1.85*  CALCIUM 8.8*  --  8.4*  PROT 6.8  --   --   ALBUMIN 3.8  --   --   AST 40  --   --   ALT 77*  --   --   ALKPHOS 51  --   --   BILITOT 1.7*  --   --   GFRNONAA 33*  --  36*  ANIONGAP 10  --  7     Hematology Recent Labs  Lab 10/09/21 1122 10/09/21 1136  WBC 10.8*  --   RBC 4.53  --   HGB 13.6 13.6  HCT  42.2 40.0  MCV 93.2  --   MCH 30.0  --   MCHC 32.2  --   RDW 13.2  --   PLT 197  --     Cardiac EnzymesNo results for input(s): TROPONINI in the last 168 hours. No results for input(s): TROPIPOC in the last 168 hours.   BNP Recent Labs  Lab 10/09/21 1127  BNP 741.4*     DDimer No results for input(s): DDIMER in the last 168 hours.   Radiology    EP PPM/ICD IMPLANT  Result Date: 10/11/2021 CONCLUSIONS:  1. 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  2. No early apparent complications.       10/13/2021, MD 10/11/2021 10:13 AM   ECHOCARDIOGRAM COMPLETE  Result Date: 10/09/2021    ECHOCARDIOGRAM REPORT   Patient Name:   Alexander Smith Date of Exam:  10/09/2021 Medical Rec #:  [id number]        Height:       69.0 in Accession #:    [id number]       Weight:       180.0 lb Date of Birth:  Feb 19, 1941        BSA:          1.976 m Patient Age:    80 years         BP:           183/60 mmHg Patient Gender: M                HR:           33 bpm. Exam Location:  Inpatient Procedure: 2D Echo, Cardiac Doppler and Color Doppler Indications:    Abnormal EKG  History:        Patient has no prior history of Echocardiogram examinations.                 Arrythmias:Heart block.  Sonographer:    Cleatis Polka Referring Phys: [id number] RENEE LYNN URSUY IMPRESSIONS  1. Left ventricular ejection fraction, by estimation, is 60 to 65%. The left ventricle has normal function. The left ventricle has no regional wall motion abnormalities. Left ventricular diastolic parameters are indeterminate.  2. Right ventricular systolic function is normal. The right ventricular size is normal. Tricuspid regurgitation signal is inadequate for assessing PA pressure.  3. The mitral valve is degenerative. Mild mitral valve regurgitation. No evidence of mitral stenosis.  4. The aortic valve is calcified. Aortic valve regurgitation is not visualized. Mild aortic  valve stenosis. Aortic valve area, by VTI measures 1.97 cm. Aortic valve mean gradient measures 16.0 mmHg. Aortic valve Vmax measures 2.71 m/s.  5. Aortic dilatation noted. There is mild dilatation of the ascending aorta, measuring 41 mm.  6. The inferior vena cava is normal in size with <50% respiratory variability, suggesting right atrial pressure of 8 mmHg. FINDINGS  Left Ventricle: Left ventricular ejection fraction, by estimation, is 60 to 65%. The left ventricle has normal function. The left ventricle has no regional wall motion abnormalities. The left ventricular internal cavity size was normal in size. There is  no left ventricular hypertrophy. Left ventricular diastolic parameters are indeterminate. Normal left ventricular filling pressure. Right Ventricle: The right ventricular size is normal. No increase in right ventricular wall thickness. Right ventricular systolic function is normal. Tricuspid regurgitation signal is inadequate for assessing PA pressure. Left Atrium: Left atrial size was normal in size. Right Atrium: Right atrial size was normal in size. Pericardium: There is no evidence of pericardial effusion. Mitral Valve: The mitral valve is degenerative in appearance. There is mild calcification of the anterior mitral valve leaflet(s). Mild to moderate mitral annular calcification. Mild mitral valve regurgitation. No evidence of mitral valve stenosis. Tricuspid Valve: The tricuspid valve is normal in structure. Tricuspid valve regurgitation is trivial. No evidence of tricuspid stenosis. Aortic Valve: The aortic valve is calcified. Aortic valve regurgitation is not visualized. Mild aortic stenosis is present. Aortic valve mean gradient measures 16.0 mmHg. Aortic valve peak gradient measures 29.4 mmHg. Aortic valve area, by VTI measures 1.97 cm. Pulmonic Valve: The pulmonic valve was normal in structure. Pulmonic valve regurgitation is trivial. No evidence of pulmonic stenosis. Aorta: Aortic  dilatation noted. There is mild dilatation of the ascending aorta, measuring 41 mm. Venous: The inferior vena cava is normal in size with less than 50% respiratory variability, suggesting right  atrial pressure of 8 mmHg. IAS/Shunts: The interatrial septum appears to be lipomatous. No atrial level shunt detected by color flow Doppler.  LEFT VENTRICLE PLAX 2D LVIDd:         5.30 cm      Diastology LVIDs:         2.80 cm      LV e' medial:    10.80 cm/s LV PW:         1.10 cm      LV E/e' medial:  14.1 LV IVS:        1.00 cm      LV e' lateral:   12.90 cm/s LVOT diam:     2.10 cm      LV E/e' lateral: 11.8 LV SV:         121 LV SV Index:   61 LVOT Area:     3.46 cm  LV Volumes (MOD) LV vol d, MOD A2C: 111.0 ml LV vol d, MOD A4C: 124.0 ml LV vol s, MOD A2C: 44.0 ml LV vol s, MOD A4C: 52.5 ml LV SV MOD A2C:     67.0 ml LV SV MOD A4C:     124.0 ml LV SV MOD BP:      69.5 ml RIGHT VENTRICLE             IVC RV Basal diam:  3.90 cm     IVC diam: 2.00 cm RV S prime:     15.50 cm/s TAPSE (M-mode): 3.0 cm LEFT ATRIUM             Index        RIGHT ATRIUM           Index LA diam:        4.30 cm 2.18 cm/m   RA Area:     16.10 cm LA Vol (A2C):   60.2 ml 30.47 ml/m  RA Volume:   40.50 ml  20.50 ml/m LA Vol (A4C):   50.0 ml 25.31 ml/m LA Biplane Vol: 54.9 ml 27.79 ml/m  AORTIC VALVE AV Area (Vmax):    1.95 cm AV Area (Vmean):   2.09 cm AV Area (VTI):     1.97 cm AV Vmax:           271.25 cm/s AV Vmean:          192.000 cm/s AV VTI:            0.612 m AV Peak Grad:      29.4 mmHg AV Mean Grad:      16.0 mmHg LVOT Vmax:         152.50 cm/s LVOT Vmean:        116.000 cm/s LVOT VTI:          0.348 m LVOT/AV VTI ratio: 0.57  AORTA Ao Root diam: 3.30 cm Ao Asc diam:  4.10 cm MITRAL VALVE MV Area (PHT): 4.04 cm     SHUNTS MV Decel Time: 188 msec     Systemic VTI:  0.35 m MV E velocity: 152.00 cm/s  Systemic Diam: 2.10 cm MV A velocity: 115.00 cm/s MV E/A ratio:  1.32 Armanda Magic MD Electronically signed by Armanda Magic MD  Signature Date/Time: 10/09/2021/3:38:12 PM    Final     Cardiac Studies   none  Patient Profile     80 y.o. male admitted with CHB s/p PPM insertion.   Assessment & Plan    PPM - he is doing well. We will  check a CXR, ECG and interrogate his device and if all good, dc home later today at 1700.  HTN - I would like him to switch from metoprolol to toprol xl 50 mg daily. DC home with usual followup.   For questions or updates, please contact [facility] Please consult [email or web address] for contact info under Cardiology/STEMI.      Signed, Lewayne Bunting, MD  10/11/2021, 11:48 AM

## 2021-10-13 ENCOUNTER — Encounter (HOSPITAL_COMMUNITY): Payer: Self-pay | Admitting: Internal Medicine

## 2021-10-14 LAB — CULTURE, BLOOD (ROUTINE X 2)
Culture: NO GROWTH
Culture: NO GROWTH
Special Requests: ADEQUATE
Special Requests: ADEQUATE

## 2021-10-23 ENCOUNTER — Other Ambulatory Visit: Payer: Self-pay

## 2021-10-23 ENCOUNTER — Ambulatory Visit (INDEPENDENT_AMBULATORY_CARE_PROVIDER_SITE_OTHER): Payer: Medicare HMO

## 2021-10-23 DIAGNOSIS — I442 Atrioventricular block, complete: Secondary | ICD-10-CM

## 2021-10-23 NOTE — Progress Notes (Signed)
Wound check appointment s/p dual chamber pacemaker placement 10/11/21. Patient presents to clinic today with outer dressing still intact. Outer dressing and Steri-strips removed. Implant site mildly edematous and device fluctuate in pocket. Patient continues to take coumadin as prescribed. Incision edges approximated with the exception of the right most distal aspect (see note in epic). Dr. Ladona Ridgel in to assess. INR obtained today and resulted at 2.4. No change in coumadin dosing and patient advised to continue to take as prescribed. Per Dr. Ladona Ridgel, open area covered with sterile band aid and patient is instructed to monitor closely. Normal device function. Thresholds, sensing, and impedances consistent with implant measurements. Device programmed at 3.5V/auto capture programmed on for extra safety margin until 3 month visit. Histogram distribution appropriate for patient and level of activity. No mode switches or high ventricular rates noted. Patient educated about wound care, arm mobility, lifting restrictions. Patient enrolled in remote monitoring with next transmission scheduled 01/13/22. 91 day FU visit with Dr. Ladona Ridgel 01/15/22.

## 2021-10-23 NOTE — Patient Instructions (Addendum)
   After Your Pacemaker   Monitor your pacemaker site for redness, swelling, and drainage. Call the device clinic at [phone] if you experience these symptoms or fever/chills. If swelling worsens call device clinic same or next business day if after hours.     Your INR was 2.4 today. Please continue to take your coumadin as prescribed.   Your incision was closed with Steri-strips or staples:  Please do not shower until your incision is completely closed. Avoid lotions, ointments, or perfumes over your incision until it is well-healed.  You may use a hot tub or a pool after your wound check appointment when the incision is completely closed.  Do not lift, push or pull greater than 10 pounds with the affected arm until 6 weeks after your procedure. There are no other restrictions in arm movement after your wound check appointment. December 11  You may drive, unless driving has been restricted by your healthcare providers.  Your Pacemaker is MRI compatible.  Remote monitoring is used to monitor your pacemaker from home. This monitoring is scheduled every 91 days by our office. It allows [place] to keep an eye on the functioning of your device to ensure it is working properly. You will routinely see your Electrophysiologist annually (more often if necessary).

## 2021-10-24 LAB — CUP PACEART INCLINIC DEVICE CHECK
Battery Remaining Longevity: 133 mo
Battery Voltage: 3.21 V
Brady Statistic AP VP Percent: 51.08 %
Brady Statistic AP VS Percent: 0 %
Brady Statistic AS VP Percent: 48.68 %
Brady Statistic AS VS Percent: 0.24 %
Brady Statistic RA Percent Paced: 51.03 %
Brady Statistic RV Percent Paced: 99.76 %
Date Time Interrogation Session: 20221110161100
Implantable Lead Implant Date: 20221029
Implantable Lead Implant Date: 20221029
Implantable Lead Location: 753859
Implantable Lead Location: 753860
Implantable Lead Model: 3830
Implantable Lead Model: 5076
Implantable Pulse Generator Implant Date: 20221029
Lead Channel Impedance Value: 323 Ohm
Lead Channel Impedance Value: 456 Ohm
Lead Channel Impedance Value: 456 Ohm
Lead Channel Impedance Value: 608 Ohm
Lead Channel Pacing Threshold Amplitude: 0.5 V
Lead Channel Pacing Threshold Amplitude: 0.625 V
Lead Channel Pacing Threshold Pulse Width: 0.4 ms
Lead Channel Pacing Threshold Pulse Width: 0.4 ms
Lead Channel Sensing Intrinsic Amplitude: 3.375 mV
Lead Channel Setting Pacing Amplitude: 3.5 V
Lead Channel Setting Pacing Amplitude: 3.5 V
Lead Channel Setting Pacing Pulse Width: 0.4 ms
Lead Channel Setting Sensing Sensitivity: 1.2 mV

## 2022-01-13 ENCOUNTER — Ambulatory Visit (INDEPENDENT_AMBULATORY_CARE_PROVIDER_SITE_OTHER): Payer: Medicare HMO

## 2022-01-13 DIAGNOSIS — I442 Atrioventricular block, complete: Secondary | ICD-10-CM | POA: Diagnosis not present

## 2022-01-13 LAB — CUP PACEART REMOTE DEVICE CHECK
Battery Remaining Longevity: 143 mo
Battery Voltage: 3.17 V
Brady Statistic AP VP Percent: 57.4 %
Brady Statistic AP VS Percent: 0 %
Brady Statistic AS VP Percent: 40.71 %
Brady Statistic AS VS Percent: 1.9 %
Brady Statistic RA Percent Paced: 58.36 %
Brady Statistic RV Percent Paced: 98.1 %
Date Time Interrogation Session: 20230130215906
Implantable Lead Implant Date: 20221029
Implantable Lead Implant Date: 20221029
Implantable Lead Location: 753859
Implantable Lead Location: 753860
Implantable Lead Model: 3830
Implantable Lead Model: 5076
Implantable Pulse Generator Implant Date: 20221029
Lead Channel Impedance Value: 304 Ohm
Lead Channel Impedance Value: 418 Ohm
Lead Channel Impedance Value: 418 Ohm
Lead Channel Impedance Value: 589 Ohm
Lead Channel Pacing Threshold Amplitude: 0.5 V
Lead Channel Pacing Threshold Amplitude: 0.5 V
Lead Channel Pacing Threshold Pulse Width: 0.4 ms
Lead Channel Pacing Threshold Pulse Width: 0.4 ms
Lead Channel Sensing Intrinsic Amplitude: 3.125 mV
Lead Channel Sensing Intrinsic Amplitude: 3.125 mV
Lead Channel Setting Pacing Amplitude: 1.5 V
Lead Channel Setting Pacing Amplitude: 2 V
Lead Channel Setting Pacing Pulse Width: 0.4 ms
Lead Channel Setting Sensing Sensitivity: 1.2 mV

## 2022-01-15 ENCOUNTER — Other Ambulatory Visit: Payer: Self-pay

## 2022-01-15 ENCOUNTER — Encounter (INDEPENDENT_AMBULATORY_CARE_PROVIDER_SITE_OTHER): Payer: Self-pay

## 2022-01-15 ENCOUNTER — Ambulatory Visit (INDEPENDENT_AMBULATORY_CARE_PROVIDER_SITE_OTHER): Payer: Medicare HMO | Admitting: Internal Medicine

## 2022-01-15 ENCOUNTER — Encounter: Payer: Self-pay | Admitting: Internal Medicine

## 2022-01-15 VITALS — BP 188/80 | HR 75 | Ht 69.0 in | Wt 192.0 lb

## 2022-01-15 DIAGNOSIS — I442 Atrioventricular block, complete: Secondary | ICD-10-CM

## 2022-01-15 DIAGNOSIS — Z950 Presence of cardiac pacemaker: Secondary | ICD-10-CM

## 2022-01-15 NOTE — Patient Instructions (Signed)
Medication Instructions:  Your physician recommends that you continue on your current medications as directed. Please refer to the Current Medication list given to you today.  Labwork: None ordered.  Testing/Procedures: None ordered.  Follow-Up: Your physician wants you to follow-up in: one year with Lewayne Bunting, MD or one of the following Advanced Practice Providers on your designated Care Team:   Francis Dowse, [place] Casimiro Needle "Mardelle Matte" Lanna Poche, [place]  Remote monitoring is used to monitor your Pacemaker from home. This monitoring reduces the number of office visits required to check your device to one time per year. It allows [place] to keep an eye on the functioning of your device to ensure it is working properly. You are scheduled for a device check from home on 04/14/2022. You may send your transmission at any time that day. If you have a wireless device, the transmission will be sent automatically. After your physician reviews your transmission, you will receive a postcard with your next transmission date.  Any Other Special Instructions Will Be Listed Below (If Applicable).  If you need a refill on your cardiac medications before your next appointment, please call your pharmacy.

## 2022-01-15 NOTE — Progress Notes (Signed)
HPI Mr. Berge returns today for followup. He is a pleasant 81 yo man with a h/o CHB s/p PPM insertion. He has done well in the interim. He denies chest pain or sob. No syncope. He also has a h/o atrial fib. He has been maintained on systemic anti-coagulation. He notes a remote blood clot in the left leg and lung.  Allergies  Allergen Reactions   Aspirin Other (See Comments)    Due to Coumadin     Current Outpatient Medications  Medication Sig Dispense Refill   finasteride (PROSCAR) 5 MG tablet Take 5 mg by mouth every morning.     hydrochlorothiazide (HYDRODIURIL) 25 MG tablet Take 25 mg by mouth daily.     lisinopril (ZESTRIL) 20 MG tablet Take 20 mg by mouth daily.     metoprolol succinate (TOPROL XL) 50 MG 24 hr tablet Take 1 tablet (50 mg total) by mouth daily. Take with or immediately following a meal. 90 tablet 3   warfarin (COUMADIN) 2.5 MG tablet Take 2.5 mg by mouth See admin instructions. 2.5mg  daily on Tuesday Wednesday Thursday Saturday Sunday     warfarin (COUMADIN) 5 MG tablet Take 5 mg by mouth See admin instructions. 5mg  daily on Monday and Friday     No current facility-administered medications for this visit.     Past Medical History:  Diagnosis Date   Arthritis    Chronic anticoagulation    DVT (deep venous thrombosis) (HCC)    left leg   Gall stones    History of kidney stones    Hypertension    Prostate enlargement     ROS:   All systems reviewed and negative except as noted in the HPI.   Past Surgical History:  Procedure Laterality Date   BACK SURGERY     lumbar   EYE SURGERY Bilateral    cataract extraction with IOL   HERNIA REPAIR     PACEMAKER IMPLANT N/A 10/11/2021   Procedure: PACEMAKER IMPLANT;  Surgeon: Evans Lance, MD;  Location: [facility];  Service: Cardiovascular;  Laterality: N/A;   SHOULDER OPEN ROTATOR CUFF REPAIR Right 07/26/2014   Procedure: RIGHT ROTATOR CUFF REPAIR SHOULDER OPEN ;  Surgeon: Tobi Bastos, MD;  Location: [facility];  Service: Orthopedics;  Laterality: Right;     History reviewed. No pertinent family history.   Social History   Socioeconomic History   Marital status: Married    Spouse name: Not on file   Number of children: Not on file   Years of education: Not on file   Highest education level: Not on file  Occupational History   Not on file  Tobacco Use   Smoking status: Never   Smokeless tobacco: Never  Substance and Sexual Activity   Alcohol use: No    Comment: occ beer   Drug use: No   Sexual activity: Not on file  Other Topics Concern   Not on file  Social History Narrative   Not on file   Social Determinants of Health   Financial Resource Strain: Not on file  Food Insecurity: Not on file  Transportation Needs: Not on file  Physical Activity: Not on file  Stress: Not on file  Social Connections: Not on file  Intimate Partner Violence: Not on file     BP (!) 188/80    Pulse 75    Ht 5\' 9"  (1.753 m)    Wt 192 lb (87.1 kg)  SpO2 97%    BMI 28.35 kg/m   Physical Exam:  Well appearing NAD HEENT: Unremarkable Neck:  No JVD, no thyromegally Lymphatics:  No adenopathy Back:  No CVA tenderness Lungs:  Clear with no wheezes HEART:  Regular rate rhythm, no murmurs, no rubs, no clicks Abd:  soft, positive bowel sounds, no organomegally, no rebound, no guarding Ext:  2 plus pulses, no edema, no cyanosis, no clubbing Skin:  No rashes no nodules Neuro:  CN II through XII intact, motor grossly intact  EKG - nsr with ventricular pacing  DEVICE  Normal device function.  See PaceArt for details.   Assess/Plan:  PPM - his Medtronic DDD PM is working normally. His output has been turned down.   PAF - He has not had any atrial fib since his PPM implant. Coags - He will continue elliquis. No bleeding HTN - His bp is usually under 140 at home. He has white coat HTN.  Carleene Overlie Estiven Kohan,MD

## 2022-01-21 ENCOUNTER — Telehealth: Payer: Self-pay | Admitting: Internal Medicine

## 2022-01-21 NOTE — Progress Notes (Signed)
Remote pacemaker transmission.   

## 2022-01-21 NOTE — Telephone Encounter (Signed)
° °  Patient Name: Coen Miyasato  DOB: 05-07-1941 MRN: [id number]  Primary Cardiologist: Dr. Ladona Ridgel  Chart reviewed as part of pre-operative protocol coverage.   IF SIMPLE EXTRACTION/CLEANINGS: Simple dental cleanings are considered low risk procedures per guidelines and generally do not require any specific cardiac clearance. It is also generally accepted that for simple extractions and dental cleanings, there is no need to interrupt blood thinner therapy.Based on history provided by Dr. Lubertha Basque last note, SBE prophylaxis is not required for the patient from a cardiac standpoint.  I reviewed with the device team regarding the question of using a small ultrasonic cavitron for his cleaning. Dr. Ladona Ridgel and rep Whitney Post confirm this is not an issue and patient can proceed.  I spoke with dental office to verbally hand off this recommendation. I will route this recommendation to the requesting party via Epic fax function and remove from pre-op pool.  Please call with questions.  Laurann Montana, PA-C 01/21/2022, 10:32 AM

## 2022-01-21 NOTE — Telephone Encounter (Signed)
° °  [facility] Pre-operative Risk Assessment    Request for surgical clearance:  What type of surgery is being performed?  Dental Cleaning    When is this surgery scheduled?  Today, patient is currently in the chair    What type of clearance is required (medical clearance vs. Pharmacy clearance to hold med vs. Both)?  Both   Are there any medications that need to be held prior to surgery and how long?  They would like our recommendation regarding medications. They would also like to know whether the patient needs to pre-med and whether they are able to use a small ultrasonic cavitron for his cleaning.  Practice name and name of physician performing surgery?  [facility]   Dr. Clovis Fredrickson, DDS  What is your office phone number [phone]    7.   What is your office fax number? [phone]  8.   Anesthesia type (None, local, MAC, general) ?  None    Alexander Smith 01/21/2022, 10:16 AM

## 2022-01-26 LAB — CUP PACEART INCLINIC DEVICE CHECK
Battery Remaining Longevity: 148 mo
Battery Voltage: 3.17 V
Brady Statistic AP VP Percent: 57.69 %
Brady Statistic AP VS Percent: 0 %
Brady Statistic AS VP Percent: 40.45 %
Brady Statistic AS VS Percent: 1.86 %
Brady Statistic RA Percent Paced: 58.63 %
Brady Statistic RV Percent Paced: 98.14 %
Date Time Interrogation Session: 20230202145900
Implantable Lead Implant Date: 20221029
Implantable Lead Implant Date: 20221029
Implantable Lead Location: 753859
Implantable Lead Location: 753860
Implantable Lead Model: 3830
Implantable Lead Model: 5076
Implantable Pulse Generator Implant Date: 20221029
Lead Channel Impedance Value: 304 Ohm
Lead Channel Impedance Value: 418 Ohm
Lead Channel Impedance Value: 418 Ohm
Lead Channel Impedance Value: 570 Ohm
Lead Channel Pacing Threshold Amplitude: 0.5 V
Lead Channel Pacing Threshold Amplitude: 0.5 V
Lead Channel Pacing Threshold Pulse Width: 0.4 ms
Lead Channel Pacing Threshold Pulse Width: 0.4 ms
Lead Channel Sensing Intrinsic Amplitude: 1.125 mV
Lead Channel Sensing Intrinsic Amplitude: 1.75 mV
Lead Channel Setting Pacing Amplitude: 1.5 V
Lead Channel Setting Pacing Amplitude: 2 V
Lead Channel Setting Pacing Pulse Width: 0.4 ms
Lead Channel Setting Sensing Sensitivity: 1.2 mV

## 2022-02-11 IMAGING — DX DG CHEST 1V PORT
1 series · 1 of 1 positions shown · non-contrast
Comparison: March 12, 2020.

CLINICAL DATA: Chest pain, dyspnea.

EXAM:
PORTABLE CHEST 1 VIEW

[chest ap]
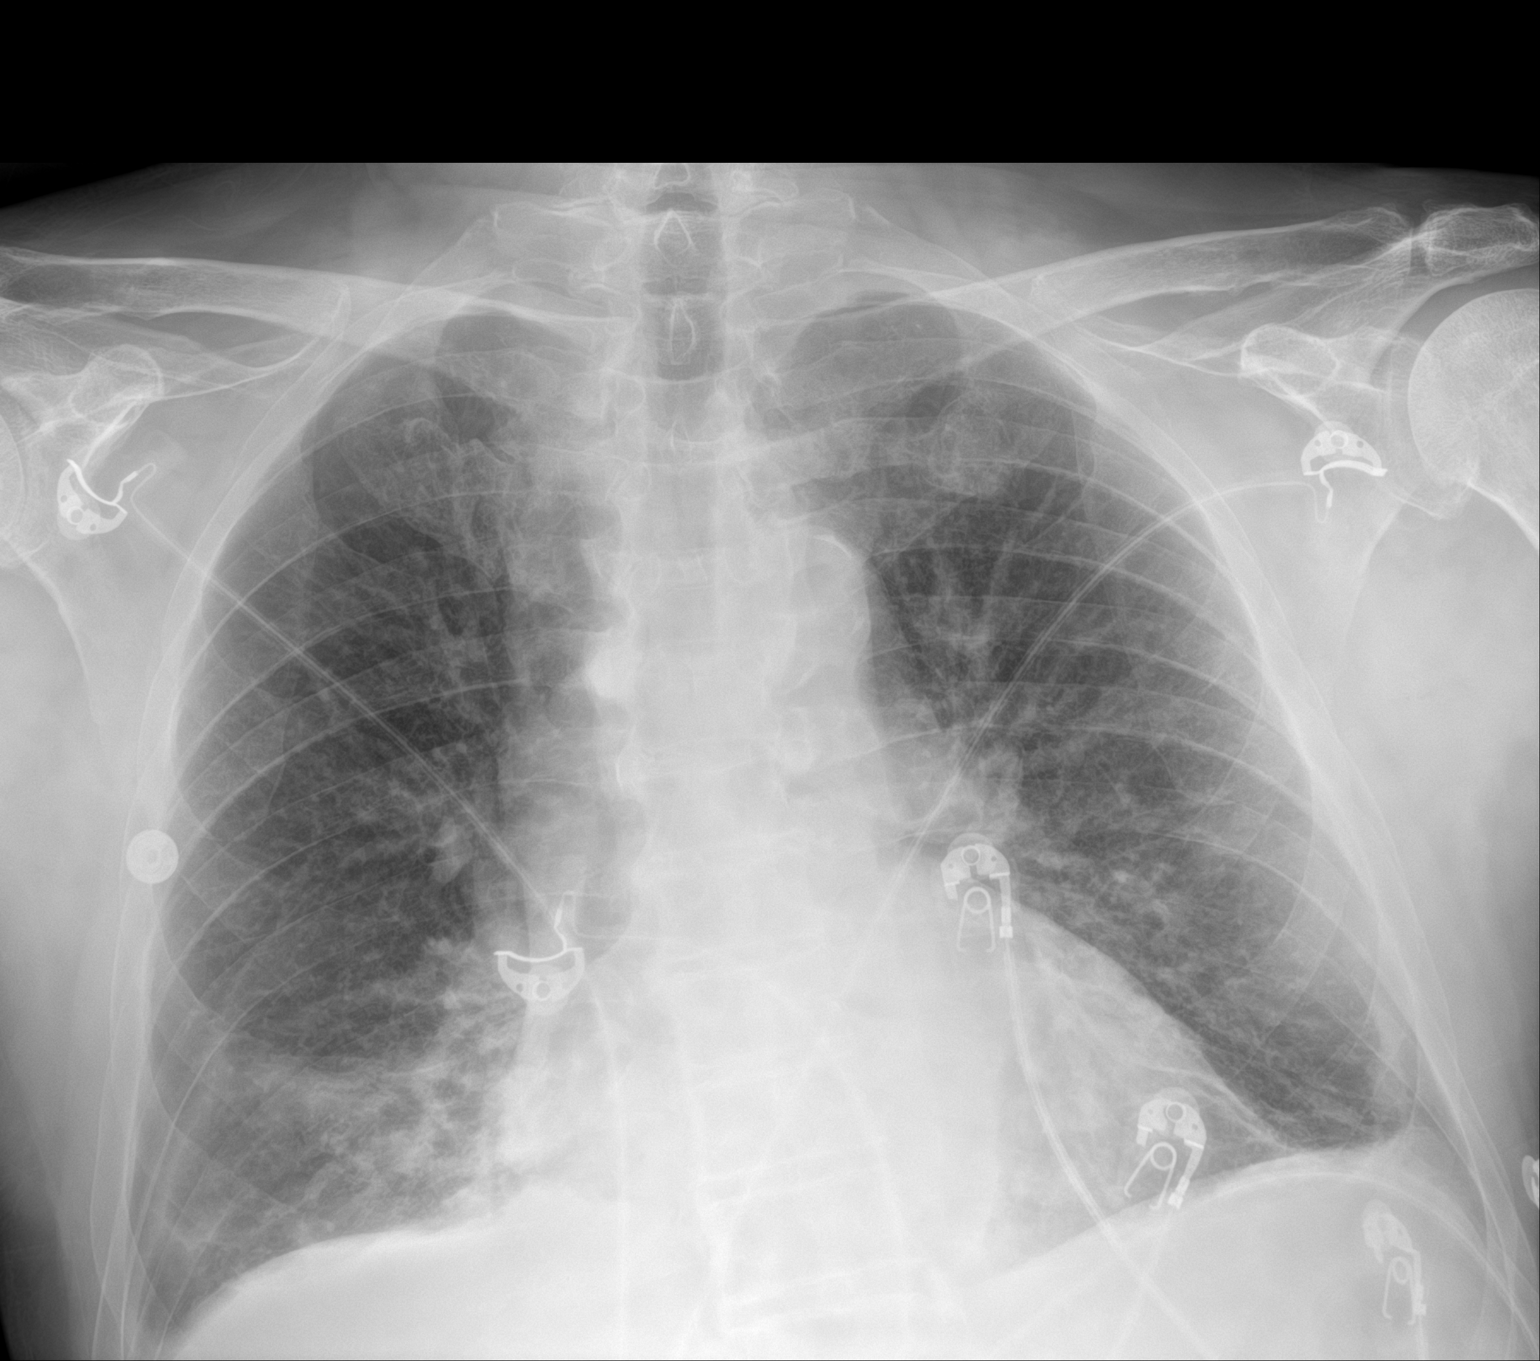

[1 of 1 positions shown; findings below may reference images not displayed]

FINDINGS: The heart size and mediastinal contours are within normal limits.
Mild right basilar atelectasis or infiltrate is noted. Minimal left
basilar subsegmental atelectasis is noted. The visualized skeletal
structures are unremarkable.
IMPRESSION: Mild right basilar atelectasis or infiltrate is noted. Minimal left
basilar subsegmental atelectasis.

## 2022-02-13 IMAGING — CR DG CHEST 2V
2 series · 2 of 2 positions shown · non-contrast
Comparison: October 09, 2021.

CLINICAL DATA: Status post pacemaker placement.

EXAM:
CHEST - 2 VIEW

[chest lat]
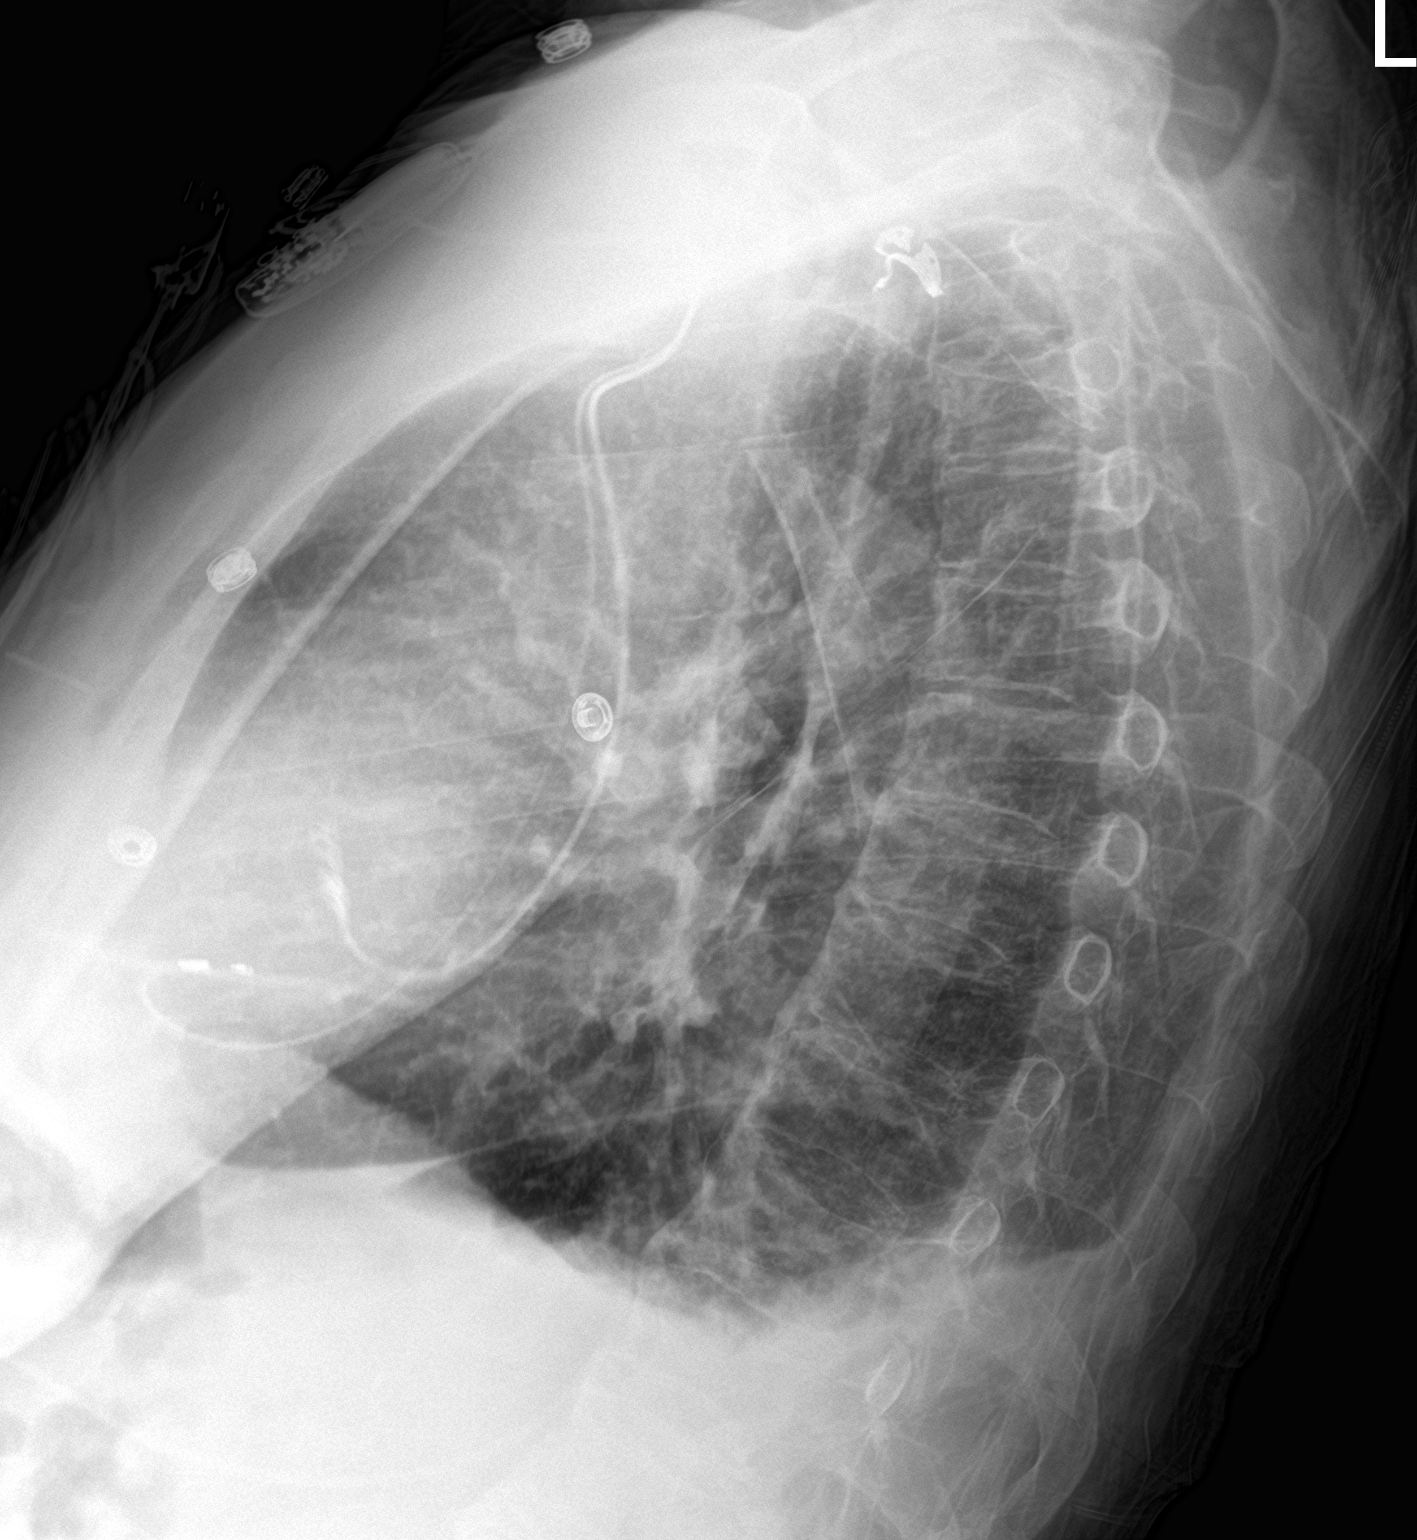

[chest ap]
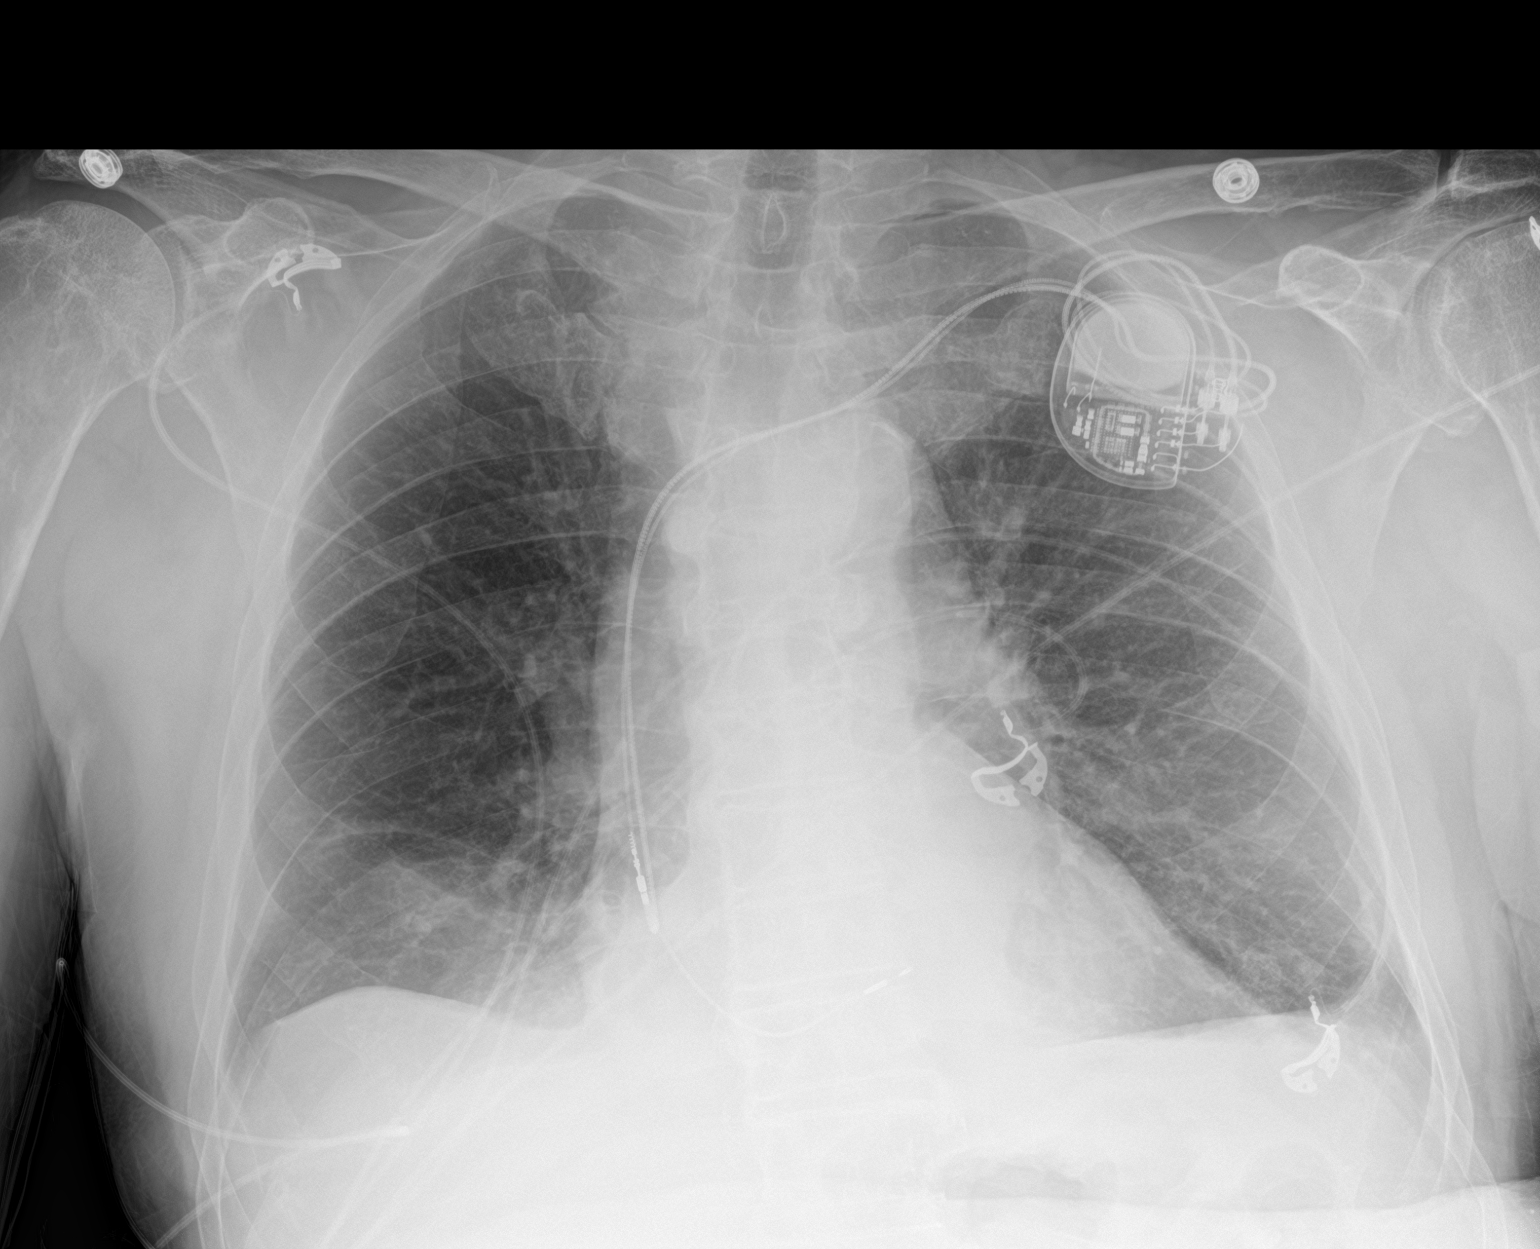

[2 of 2 positions shown; findings below may reference images not displayed]

FINDINGS: Stable cardiomediastinal silhouette. Interval placement of
left-sided pacemaker with leads in grossly good position. No
pneumothorax is noted. Mild bibasilar subsegmental atelectasis is
noted with small pleural effusions. Bony thorax is unremarkable.
IMPRESSION: Interval placement of left-sided pacemaker with leads in grossly
good position. No pneumothorax is noted. Small bilateral pleural
effusions are noted.

## 2022-04-14 ENCOUNTER — Ambulatory Visit (INDEPENDENT_AMBULATORY_CARE_PROVIDER_SITE_OTHER): Payer: Medicare HMO

## 2022-04-14 DIAGNOSIS — I442 Atrioventricular block, complete: Secondary | ICD-10-CM | POA: Diagnosis not present

## 2022-04-14 LAB — CUP PACEART REMOTE DEVICE CHECK
Battery Remaining Longevity: 138 mo
Battery Voltage: 3.12 V
Brady Statistic AP VP Percent: 59.18 %
Brady Statistic AP VS Percent: 0 %
Brady Statistic AS VP Percent: 40.09 %
Brady Statistic AS VS Percent: 0.73 %
Brady Statistic RA Percent Paced: 59.46 %
Brady Statistic RV Percent Paced: 99.26 %
Date Time Interrogation Session: 20230501215102
Implantable Lead Implant Date: 20221029
Implantable Lead Implant Date: 20221029
Implantable Lead Location: 753859
Implantable Lead Location: 753860
Implantable Lead Model: 3830
Implantable Lead Model: 5076
Implantable Pulse Generator Implant Date: 20221029
Lead Channel Impedance Value: 304 Ohm
Lead Channel Impedance Value: 380 Ohm
Lead Channel Impedance Value: 399 Ohm
Lead Channel Impedance Value: 570 Ohm
Lead Channel Pacing Threshold Amplitude: 0.375 V
Lead Channel Pacing Threshold Amplitude: 0.625 V
Lead Channel Pacing Threshold Pulse Width: 0.4 ms
Lead Channel Pacing Threshold Pulse Width: 0.4 ms
Lead Channel Sensing Intrinsic Amplitude: 1.625 mV
Lead Channel Sensing Intrinsic Amplitude: 1.625 mV
Lead Channel Setting Pacing Amplitude: 1.5 V
Lead Channel Setting Pacing Amplitude: 2 V
Lead Channel Setting Pacing Pulse Width: 0.4 ms
Lead Channel Setting Sensing Sensitivity: 1.2 mV

## 2022-04-29 NOTE — Progress Notes (Signed)
Remote pacemaker transmission.   

## 2022-07-14 ENCOUNTER — Ambulatory Visit (INDEPENDENT_AMBULATORY_CARE_PROVIDER_SITE_OTHER): Payer: Medicare HMO

## 2022-07-14 DIAGNOSIS — I442 Atrioventricular block, complete: Secondary | ICD-10-CM

## 2022-07-14 LAB — CUP PACEART REMOTE DEVICE CHECK
Battery Remaining Longevity: 135 mo
Battery Voltage: 3.07 V
Brady Statistic AP VP Percent: 58.75 %
Brady Statistic AP VS Percent: 0.22 %
Brady Statistic AS VP Percent: 37.11 %
Brady Statistic AS VS Percent: 3.92 %
Brady Statistic RA Percent Paced: 60.81 %
Brady Statistic RV Percent Paced: 95.86 %
Date Time Interrogation Session: 20230731230129
Implantable Lead Implant Date: 20221029
Implantable Lead Implant Date: 20221029
Implantable Lead Location: 753859
Implantable Lead Location: 753860
Implantable Lead Model: 3830
Implantable Lead Model: 5076
Implantable Pulse Generator Implant Date: 20221029
Lead Channel Impedance Value: 304 Ohm
Lead Channel Impedance Value: 399 Ohm
Lead Channel Impedance Value: 399 Ohm
Lead Channel Impedance Value: 532 Ohm
Lead Channel Pacing Threshold Amplitude: 0.5 V
Lead Channel Pacing Threshold Amplitude: 0.75 V
Lead Channel Pacing Threshold Pulse Width: 0.4 ms
Lead Channel Pacing Threshold Pulse Width: 0.4 ms
Lead Channel Sensing Intrinsic Amplitude: 1.25 mV
Lead Channel Sensing Intrinsic Amplitude: 1.25 mV
Lead Channel Setting Pacing Amplitude: 1.5 V
Lead Channel Setting Pacing Amplitude: 2 V
Lead Channel Setting Pacing Pulse Width: 0.4 ms
Lead Channel Setting Sensing Sensitivity: 1.2 mV

## 2022-08-12 NOTE — Progress Notes (Signed)
Remote pacemaker transmission.   

## 2022-10-13 ENCOUNTER — Ambulatory Visit (INDEPENDENT_AMBULATORY_CARE_PROVIDER_SITE_OTHER): Payer: Medicare HMO

## 2022-10-13 DIAGNOSIS — I442 Atrioventricular block, complete: Secondary | ICD-10-CM | POA: Diagnosis not present

## 2022-10-13 LAB — CUP PACEART REMOTE DEVICE CHECK
Battery Remaining Longevity: 133 mo
Battery Voltage: 3.04 V
Brady Statistic AP VP Percent: 63.71 %
Brady Statistic AP VS Percent: 0.57 %
Brady Statistic AS VP Percent: 29.37 %
Brady Statistic AS VS Percent: 6.35 %
Brady Statistic RA Percent Paced: 66.92 %
Brady Statistic RV Percent Paced: 93.08 %
Date Time Interrogation Session: 20231031014115
Implantable Lead Connection Status: 753985
Implantable Lead Connection Status: 753985
Implantable Lead Implant Date: 20221029
Implantable Lead Implant Date: 20221029
Implantable Lead Location: 753859
Implantable Lead Location: 753860
Implantable Lead Model: 3830
Implantable Lead Model: 5076
Implantable Pulse Generator Implant Date: 20221029
Lead Channel Impedance Value: 304 Ohm
Lead Channel Impedance Value: 399 Ohm
Lead Channel Impedance Value: 437 Ohm
Lead Channel Impedance Value: 532 Ohm
Lead Channel Pacing Threshold Amplitude: 0.5 V
Lead Channel Pacing Threshold Amplitude: 0.75 V
Lead Channel Pacing Threshold Pulse Width: 0.4 ms
Lead Channel Pacing Threshold Pulse Width: 0.4 ms
Lead Channel Sensing Intrinsic Amplitude: 1.125 mV
Lead Channel Sensing Intrinsic Amplitude: 1.125 mV
Lead Channel Setting Pacing Amplitude: 1.5 V
Lead Channel Setting Pacing Amplitude: 2 V
Lead Channel Setting Pacing Pulse Width: 0.4 ms
Lead Channel Setting Sensing Sensitivity: 1.2 mV
Zone Setting Status: 755011
Zone Setting Status: 755011

## 2022-10-27 NOTE — Progress Notes (Signed)
Remote pacemaker transmission.   

## 2023-01-12 ENCOUNTER — Ambulatory Visit: Payer: Medicare HMO

## 2023-01-12 DIAGNOSIS — I442 Atrioventricular block, complete: Secondary | ICD-10-CM

## 2023-01-12 LAB — CUP PACEART REMOTE DEVICE CHECK
Battery Remaining Longevity: 131 mo
Battery Voltage: 3.03 V
Brady Statistic AP VP Percent: 59.77 %
Brady Statistic AP VS Percent: 0.06 %
Brady Statistic AS VP Percent: 35.71 %
Brady Statistic AS VS Percent: 4.47 %
Brady Statistic RA Percent Paced: 62.04 %
Brady Statistic RV Percent Paced: 95.48 %
Date Time Interrogation Session: 20240130030041
Implantable Lead Connection Status: 753985
Implantable Lead Connection Status: 753985
Implantable Lead Implant Date: 20221029
Implantable Lead Implant Date: 20221029
Implantable Lead Location: 753859
Implantable Lead Location: 753860
Implantable Lead Model: 3830
Implantable Lead Model: 5076
Implantable Pulse Generator Implant Date: 20221029
Lead Channel Impedance Value: 323 Ohm
Lead Channel Impedance Value: 418 Ohm
Lead Channel Impedance Value: 456 Ohm
Lead Channel Impedance Value: 551 Ohm
Lead Channel Pacing Threshold Amplitude: 0.625 V
Lead Channel Pacing Threshold Amplitude: 0.625 V
Lead Channel Pacing Threshold Pulse Width: 0.4 ms
Lead Channel Pacing Threshold Pulse Width: 0.4 ms
Lead Channel Sensing Intrinsic Amplitude: 2 mV
Lead Channel Sensing Intrinsic Amplitude: 2 mV
Lead Channel Setting Pacing Amplitude: 1.5 V
Lead Channel Setting Pacing Amplitude: 2 V
Lead Channel Setting Pacing Pulse Width: 0.4 ms
Lead Channel Setting Sensing Sensitivity: 1.2 mV
Zone Setting Status: 755011
Zone Setting Status: 755011

## 2023-02-05 NOTE — Progress Notes (Signed)
Remote pacemaker transmission.   

## 2023-03-16 ENCOUNTER — Encounter: Payer: Self-pay | Admitting: Internal Medicine

## 2023-03-16 ENCOUNTER — Ambulatory Visit: Payer: Medicare HMO | Attending: Internal Medicine | Admitting: Internal Medicine

## 2023-03-16 VITALS — BP 142/62 | HR 62 | Ht 69.0 in | Wt 191.0 lb

## 2023-03-16 DIAGNOSIS — Z950 Presence of cardiac pacemaker: Secondary | ICD-10-CM

## 2023-03-16 DIAGNOSIS — I480 Paroxysmal atrial fibrillation: Secondary | ICD-10-CM | POA: Diagnosis not present

## 2023-03-16 DIAGNOSIS — I10 Essential (primary) hypertension: Secondary | ICD-10-CM

## 2023-03-16 NOTE — Progress Notes (Signed)
HPI Mr. Hupe returns today for followup. He is a pleasant 82 yo man with a h/o CHB s/p PPM insertion. He has done well in the interim. He denies chest pain or sob. No syncope. He also has a h/o atrial fib. He has been maintained on systemic anti-coagulation. He notes a remote blood clot in the left leg and lung. He remains active exercising. He has no limitation.  Allergies  Allergen Reactions   Aspirin Other (See Comments)    Due to Coumadin     Current Outpatient Medications  Medication Sig Dispense Refill   finasteride (PROSCAR) 5 MG tablet Take 5 mg by mouth every morning.     hydrochlorothiazide (HYDRODIURIL) 25 MG tablet Take 25 mg by mouth daily.     lisinopril (ZESTRIL) 20 MG tablet Take 20 mg by mouth daily.     warfarin (COUMADIN) 2.5 MG tablet Take 2.5 mg by mouth See admin instructions. 2.5mg  daily on Tuesday Wednesday Thursday Saturday Sunday     warfarin (COUMADIN) 5 MG tablet Take 5 mg by mouth See admin instructions. 5mg  daily on Monday and Friday     metoprolol succinate (TOPROL XL) 50 MG 24 hr tablet Take 1 tablet (50 mg total) by mouth daily. Take with or immediately following a meal. 90 tablet 3   No current facility-administered medications for this visit.     Past Medical History:  Diagnosis Date   Arthritis    Chronic anticoagulation    DVT (deep venous thrombosis)    left leg   Gall stones    History of kidney stones    Hypertension    Prostate enlargement     ROS:   All systems reviewed and negative except as noted in the HPI.   Past Surgical History:  Procedure Laterality Date   BACK SURGERY     lumbar   EYE SURGERY Bilateral    cataract extraction with IOL   HERNIA REPAIR     PACEMAKER IMPLANT N/A 10/11/2021   Procedure: PACEMAKER IMPLANT;  Surgeon: Evans Lance, MD;  Location: [facility];  Service: Cardiovascular;  Laterality: N/A;   SHOULDER OPEN ROTATOR CUFF REPAIR Right 07/26/2014   Procedure: RIGHT ROTATOR CUFF  REPAIR SHOULDER OPEN ;  Surgeon: Tobi Bastos, MD;  Location: [facility];  Service: Orthopedics;  Laterality: Right;     History reviewed. No pertinent family history.   Social History   Socioeconomic History   Marital status: Married    Spouse name: Not on file   Number of children: Not on file   Years of education: Not on file   Highest education level: Not on file  Occupational History   Not on file  Tobacco Use   Smoking status: Never   Smokeless tobacco: Never  Substance and Sexual Activity   Alcohol use: No    Comment: occ beer   Drug use: No   Sexual activity: Not on file  Other Topics Concern   Not on file  Social History Narrative   Not on file   Social Determinants of Health   Financial Resource Strain: Not on file  Food Insecurity: Not on file  Transportation Needs: Not on file  Physical Activity: Not on file  Stress: Not on file  Social Connections: Not on file  Intimate Partner Violence: Not on file     BP (!) 142/62   Pulse 62   Ht 5\' 9"  (1.753 m)   Wt 191 lb (  86.6 kg)   SpO2 98%   BMI 28.21 kg/m   Physical Exam:  Well appearing NAD HEENT: Unremarkable Neck:  No JVD, no thyromegally Lymphatics:  No adenopathy Back:  No CVA tenderness Lungs:  Clear HEART:  Regular rate rhythm, no murmurs, no rubs, no clicks Abd:  soft, positive bowel sounds, no organomegally, no rebound, no guarding Ext:  2 plus pulses, no edema, no cyanosis, no clubbing Skin:  No rashes no nodules Neuro:  CN II through XII intact, motor grossly intact  EKG - nsr with ventricular pacing  DEVICE  Normal device function.  See PaceArt for details.   Assess/Plan:  PPM - his Medtronic DDD PM is working normally. His output has been turned down.   PAF - He has not had any atrial fib since his PPM implant. Coags - He will continue elliquis. No bleeding HTN - His bp is usually under 140 at home. He has white coat HTN.   Carleene Overlie Hansen Carino,MD

## 2023-03-16 NOTE — Patient Instructions (Signed)
Medication Instructions:  Your physician recommends that you continue on your current medications as directed. Please refer to the Current Medication list given to you today.  *If you need a refill on your cardiac medications before your next appointment, please call your pharmacy*  Lab Work: None ordered.  If you have labs (blood work) drawn today and your tests are completely normal, you will receive your results only by: [place] (if you have MyChart) OR A paper copy in the mail If you have any lab test that is abnormal or we need to change your treatment, we will call you to review the results.  Testing/Procedures: None ordered.  Follow-Up: At [facility], you and your health needs are our priority.  As part of our continuing mission to provide you with exceptional heart care, we have created designated Provider Care Teams.  These Care Teams include your primary Cardiologist (physician) and Advanced Practice Providers (APPs -  Physician Assistants and Nurse Practitioners) who all work together to provide you with the care you need, when you need it.  We recommend signing up for the patient portal called "MyChart".  Sign up information is provided on this After Visit Summary.  MyChart is used to connect with patients for Virtual Visits (Telemedicine).  Patients are able to view lab/test results, encounter notes, upcoming appointments, etc.  Non-urgent messages can be sent to your provider as well.   To learn more about what you can do with MyChart, go to [email or web address].    Your next appointment:   1 year(s)  The format for your next appointment:   In Person  Provider:   Cristopher Peru, MD{or one of the following Advanced Practice Providers on your designated Care Team:   Tommye Standard, [place] Legrand Como "Jonni Sanger" Chalmers Cater, [place]  Remote monitoring is used to monitor your Pacemaker from home. This monitoring reduces the number of office visits required to check your device to  one time per year. It allows [place] to keep an eye on the functioning of your device to ensure it is working properly. You are scheduled for a device check from home on 04/13/23. You may send your transmission at any time that day. If you have a wireless device, the transmission will be sent automatically. After your physician reviews your transmission, you will receive a postcard with your next transmission date.

## 2023-03-17 LAB — CUP PACEART INCLINIC DEVICE CHECK
Battery Remaining Longevity: 114 mo
Battery Voltage: 3.03 V
Brady Statistic AP VP Percent: 59.78 %
Brady Statistic AP VS Percent: 0.2 %
Brady Statistic AS VP Percent: 36.4 %
Brady Statistic AS VS Percent: 3.63 %
Brady Statistic RA Percent Paced: 61.6 %
Brady Statistic RV Percent Paced: 96.18 %
Date Time Interrogation Session: 20240402141700
Implantable Lead Connection Status: 753985
Implantable Lead Connection Status: 753985
Implantable Lead Implant Date: 20221029
Implantable Lead Implant Date: 20221029
Implantable Lead Location: 753859
Implantable Lead Location: 753860
Implantable Lead Model: 3830
Implantable Lead Model: 5076
Implantable Pulse Generator Implant Date: 20221029
Lead Channel Impedance Value: 323 Ohm
Lead Channel Impedance Value: 418 Ohm
Lead Channel Impedance Value: 494 Ohm
Lead Channel Impedance Value: 570 Ohm
Lead Channel Pacing Threshold Amplitude: 0.5 V
Lead Channel Pacing Threshold Amplitude: 0.75 V
Lead Channel Pacing Threshold Pulse Width: 0.4 ms
Lead Channel Pacing Threshold Pulse Width: 0.4 ms
Lead Channel Sensing Intrinsic Amplitude: 0.875 mV
Lead Channel Sensing Intrinsic Amplitude: 2.75 mV
Lead Channel Setting Pacing Amplitude: 2 V
Lead Channel Setting Pacing Amplitude: 2.5 V
Lead Channel Setting Pacing Pulse Width: 0.4 ms
Lead Channel Setting Sensing Sensitivity: 1.2 mV
Zone Setting Status: 755011
Zone Setting Status: 755011

## 2023-04-13 ENCOUNTER — Ambulatory Visit (INDEPENDENT_AMBULATORY_CARE_PROVIDER_SITE_OTHER): Payer: Medicare HMO

## 2023-04-13 DIAGNOSIS — I442 Atrioventricular block, complete: Secondary | ICD-10-CM | POA: Diagnosis not present

## 2023-04-14 LAB — CUP PACEART REMOTE DEVICE CHECK
Battery Remaining Longevity: 113 mo
Battery Voltage: 3.02 V
Brady Statistic AP VP Percent: 51.49 %
Brady Statistic AP VS Percent: 0.22 %
Brady Statistic AS VP Percent: 45.59 %
Brady Statistic AS VS Percent: 2.69 %
Brady Statistic RA Percent Paced: 52.76 %
Brady Statistic RV Percent Paced: 97.08 %
Date Time Interrogation Session: 20240430062751
Implantable Lead Connection Status: 753985
Implantable Lead Connection Status: 753985
Implantable Lead Implant Date: 20221029
Implantable Lead Implant Date: 20221029
Implantable Lead Location: 753859
Implantable Lead Location: 753860
Implantable Lead Model: 3830
Implantable Lead Model: 5076
Implantable Pulse Generator Implant Date: 20221029
Lead Channel Impedance Value: 304 Ohm
Lead Channel Impedance Value: 418 Ohm
Lead Channel Impedance Value: 437 Ohm
Lead Channel Impedance Value: 551 Ohm
Lead Channel Pacing Threshold Amplitude: 0.5 V
Lead Channel Pacing Threshold Amplitude: 0.75 V
Lead Channel Pacing Threshold Pulse Width: 0.4 ms
Lead Channel Pacing Threshold Pulse Width: 0.4 ms
Lead Channel Sensing Intrinsic Amplitude: 1.625 mV
Lead Channel Sensing Intrinsic Amplitude: 1.625 mV
Lead Channel Setting Pacing Amplitude: 2 V
Lead Channel Setting Pacing Amplitude: 2.5 V
Lead Channel Setting Pacing Pulse Width: 0.4 ms
Lead Channel Setting Sensing Sensitivity: 1.2 mV
Zone Setting Status: 755011
Zone Setting Status: 755011

## 2023-05-05 NOTE — Progress Notes (Signed)
Remote pacemaker transmission.   

## 2023-07-13 ENCOUNTER — Ambulatory Visit (INDEPENDENT_AMBULATORY_CARE_PROVIDER_SITE_OTHER): Payer: Medicare HMO

## 2023-07-13 DIAGNOSIS — I442 Atrioventricular block, complete: Secondary | ICD-10-CM | POA: Diagnosis not present

## 2023-08-04 NOTE — Progress Notes (Signed)
Remote pacemaker transmission.   

## 2023-10-12 ENCOUNTER — Ambulatory Visit (INDEPENDENT_AMBULATORY_CARE_PROVIDER_SITE_OTHER): Payer: Medicare HMO

## 2023-10-12 DIAGNOSIS — I442 Atrioventricular block, complete: Secondary | ICD-10-CM | POA: Diagnosis not present

## 2023-10-14 LAB — CUP PACEART REMOTE DEVICE CHECK
Battery Remaining Longevity: 106 mo
Battery Voltage: 3.01 V
Brady Statistic AP VP Percent: 54.86 %
Brady Statistic AP VS Percent: 0 %
Brady Statistic AS VP Percent: 45.1 %
Brady Statistic AS VS Percent: 0.03 %
Brady Statistic RA Percent Paced: 54.85 %
Brady Statistic RV Percent Paced: 99.97 %
Date Time Interrogation Session: 20241028211224
Implantable Lead Connection Status: 753985
Implantable Lead Connection Status: 753985
Implantable Lead Implant Date: 20221029
Implantable Lead Implant Date: 20221029
Implantable Lead Location: 753859
Implantable Lead Location: 753860
Implantable Lead Model: 3830
Implantable Lead Model: 5076
Implantable Pulse Generator Implant Date: 20221029
Lead Channel Impedance Value: 285 Ohm
Lead Channel Impedance Value: 399 Ohm
Lead Channel Impedance Value: 418 Ohm
Lead Channel Impedance Value: 551 Ohm
Lead Channel Pacing Threshold Amplitude: 0.5 V
Lead Channel Pacing Threshold Amplitude: 0.75 V
Lead Channel Pacing Threshold Pulse Width: 0.4 ms
Lead Channel Pacing Threshold Pulse Width: 0.4 ms
Lead Channel Sensing Intrinsic Amplitude: 0.875 mV
Lead Channel Sensing Intrinsic Amplitude: 0.875 mV
Lead Channel Sensing Intrinsic Amplitude: 28 mV
Lead Channel Sensing Intrinsic Amplitude: 28 mV
Lead Channel Setting Pacing Amplitude: 2 V
Lead Channel Setting Pacing Amplitude: 2.5 V
Lead Channel Setting Pacing Pulse Width: 0.4 ms
Lead Channel Setting Sensing Sensitivity: 1.2 mV
Zone Setting Status: 755011
Zone Setting Status: 755011

## 2023-11-02 NOTE — Progress Notes (Signed)
Remote pacemaker transmission.   

## 2024-01-11 ENCOUNTER — Ambulatory Visit (INDEPENDENT_AMBULATORY_CARE_PROVIDER_SITE_OTHER): Payer: Medicare HMO

## 2024-01-11 DIAGNOSIS — I442 Atrioventricular block, complete: Secondary | ICD-10-CM

## 2024-01-11 LAB — CUP PACEART REMOTE DEVICE CHECK
Battery Remaining Longevity: 103 mo
Battery Voltage: 3.01 V
Brady Statistic AP VP Percent: 48.44 %
Brady Statistic AP VS Percent: 0 %
Brady Statistic AS VP Percent: 51.5 %
Brady Statistic AS VS Percent: 0.06 %
Brady Statistic RA Percent Paced: 48.45 %
Brady Statistic RV Percent Paced: 99.94 %
Date Time Interrogation Session: 20250127225533
Implantable Lead Connection Status: 753985
Implantable Lead Connection Status: 753985
Implantable Lead Implant Date: 20221029
Implantable Lead Implant Date: 20221029
Implantable Lead Location: 753859
Implantable Lead Location: 753860
Implantable Lead Model: 3830
Implantable Lead Model: 5076
Implantable Pulse Generator Implant Date: 20221029
Lead Channel Impedance Value: 304 Ohm
Lead Channel Impedance Value: 418 Ohm
Lead Channel Impedance Value: 418 Ohm
Lead Channel Impedance Value: 570 Ohm
Lead Channel Pacing Threshold Amplitude: 0.5 V
Lead Channel Pacing Threshold Amplitude: 0.75 V
Lead Channel Pacing Threshold Pulse Width: 0.4 ms
Lead Channel Pacing Threshold Pulse Width: 0.4 ms
Lead Channel Sensing Intrinsic Amplitude: 0.875 mV
Lead Channel Sensing Intrinsic Amplitude: 0.875 mV
Lead Channel Sensing Intrinsic Amplitude: 28 mV
Lead Channel Sensing Intrinsic Amplitude: 28 mV
Lead Channel Setting Pacing Amplitude: 2 V
Lead Channel Setting Pacing Amplitude: 2.5 V
Lead Channel Setting Pacing Pulse Width: 0.4 ms
Lead Channel Setting Sensing Sensitivity: 1.2 mV
Zone Setting Status: 755011
Zone Setting Status: 755011

## 2024-02-20 NOTE — Progress Notes (Signed)
 Remote pacemaker transmission.

## 2024-04-11 ENCOUNTER — Ambulatory Visit (INDEPENDENT_AMBULATORY_CARE_PROVIDER_SITE_OTHER): Payer: Medicare HMO

## 2024-04-11 ENCOUNTER — Ambulatory Visit: Payer: Medicare HMO | Admitting: Physician Assistant

## 2024-04-11 DIAGNOSIS — I442 Atrioventricular block, complete: Secondary | ICD-10-CM | POA: Diagnosis not present

## 2024-04-11 LAB — CUP PACEART REMOTE DEVICE CHECK
Battery Remaining Longevity: 99 mo
Battery Voltage: 3.01 V
Brady Statistic AP VP Percent: 53.06 %
Brady Statistic AP VS Percent: 0 %
Brady Statistic AS VP Percent: 46.87 %
Brady Statistic AS VS Percent: 0.07 %
Brady Statistic RA Percent Paced: 53.06 %
Brady Statistic RV Percent Paced: 99.93 %
Date Time Interrogation Session: 20250429035416
Implantable Lead Connection Status: 753985
Implantable Lead Connection Status: 753985
Implantable Lead Implant Date: 20221029
Implantable Lead Implant Date: 20221029
Implantable Lead Location: 753859
Implantable Lead Location: 753860
Implantable Lead Model: 3830
Implantable Lead Model: 5076
Implantable Pulse Generator Implant Date: 20221029
Lead Channel Impedance Value: 285 Ohm
Lead Channel Impedance Value: 399 Ohm
Lead Channel Impedance Value: 399 Ohm
Lead Channel Impedance Value: 551 Ohm
Lead Channel Pacing Threshold Amplitude: 0.5 V
Lead Channel Pacing Threshold Amplitude: 0.75 V
Lead Channel Pacing Threshold Pulse Width: 0.4 ms
Lead Channel Pacing Threshold Pulse Width: 0.4 ms
Lead Channel Sensing Intrinsic Amplitude: 1.25 mV
Lead Channel Sensing Intrinsic Amplitude: 1.25 mV
Lead Channel Sensing Intrinsic Amplitude: 28 mV
Lead Channel Sensing Intrinsic Amplitude: 28 mV
Lead Channel Setting Pacing Amplitude: 2 V
Lead Channel Setting Pacing Amplitude: 2.5 V
Lead Channel Setting Pacing Pulse Width: 0.4 ms
Lead Channel Setting Sensing Sensitivity: 1.2 mV
Zone Setting Status: 755011
Zone Setting Status: 755011

## 2024-05-09 NOTE — Progress Notes (Unsigned)
  Electrophysiology Office Note:   ID:  Joanne, Brander 10/29/1941, MRN [id number]  Primary Cardiologist: None Electrophysiologist: Manya Sells, MD  {Click to update primary MD,subspecialty MD or APP then REFRESH:1}    History of Present Illness:   Alexander Smith is a 83 y.o. male with h/o CHB s/p PPM, HTN, and PAF seen today for routine electrophysiology followup.   Since last being seen in our clinic the patient reports doing ***.  he denies chest pain, palpitations, dyspnea, PND, orthopnea, nausea, vomiting, dizziness, syncope, edema, weight gain, or early satiety.   Review of systems complete and found to be negative unless listed in HPI.   EP Information / Studies Reviewed:    EKG is ordered today. Personal review as below.       PPM Interrogation-  reviewed in detail today,  See PACEART report.  Arrhythmia/Device History Medtronic dual chamber PPM implanted 09/2021 for CHB     Physical Exam:   VS:  There were no vitals taken for this visit.   Wt Readings from Last 3 Encounters:  03/16/23 191 lb (86.6 kg)  01/15/22 192 lb (87.1 kg)  10/10/21 187 lb 6.3 oz (85 kg)     GEN: No acute distress  NECK: No JVD; No carotid bruits CARDIAC: {EPRHYTHM:28826}, no murmurs, rubs, gallops RESPIRATORY:  Clear to auscultation without rales, wheezing or rhonchi  ABDOMEN: Soft, non-tender, non-distended EXTREMITIES:  {EDEMA LEVEL:28147::"No"} edema; No deformity   ASSESSMENT AND PLAN:    CHB s/p Medtronic PPM  Normal PPM function See Pace Art report No changes today  Paroxysmal AF Burden *** Continue Coumadin for CHA2DS2/VASc of at least 3  HTN Stable on current regimen    {Click here to Review PMH, Prob List, Meds, Allergies, SHx, FHx  :1}   Disposition:   Follow up with {EPPROVIDERS:28135} {EPFOLLOW UP:28173}  Signed, Tylene Galla, PA-C

## 2024-05-10 ENCOUNTER — Ambulatory Visit: Attending: Cardiology | Admitting: Student

## 2024-05-10 ENCOUNTER — Encounter: Payer: Self-pay | Admitting: Student

## 2024-05-10 VITALS — BP 154/68 | HR 61 | Ht 69.0 in | Wt 187.6 lb

## 2024-05-10 DIAGNOSIS — I480 Paroxysmal atrial fibrillation: Secondary | ICD-10-CM

## 2024-05-10 DIAGNOSIS — I442 Atrioventricular block, complete: Secondary | ICD-10-CM

## 2024-05-10 DIAGNOSIS — I10 Essential (primary) hypertension: Secondary | ICD-10-CM

## 2024-05-10 LAB — CUP PACEART INCLINIC DEVICE CHECK
Battery Remaining Longevity: 101 mo
Battery Voltage: 3 V
Brady Statistic AP VP Percent: 53.36 %
Brady Statistic AP VS Percent: 0.02 %
Brady Statistic AS VP Percent: 46.23 %
Brady Statistic AS VS Percent: 0.39 %
Brady Statistic RA Percent Paced: 53.51 %
Brady Statistic RV Percent Paced: 99.59 %
Date Time Interrogation Session: 20250528124227
Implantable Lead Connection Status: 753985
Implantable Lead Connection Status: 753985
Implantable Lead Implant Date: 20221029
Implantable Lead Implant Date: 20221029
Implantable Lead Location: 753859
Implantable Lead Location: 753860
Implantable Lead Model: 3830
Implantable Lead Model: 5076
Implantable Pulse Generator Implant Date: 20221029
Lead Channel Impedance Value: 323 Ohm
Lead Channel Impedance Value: 437 Ohm
Lead Channel Impedance Value: 475 Ohm
Lead Channel Impedance Value: 570 Ohm
Lead Channel Pacing Threshold Amplitude: 0.5 V
Lead Channel Pacing Threshold Amplitude: 0.75 V
Lead Channel Pacing Threshold Pulse Width: 0.4 ms
Lead Channel Pacing Threshold Pulse Width: 0.4 ms
Lead Channel Sensing Intrinsic Amplitude: 1.25 mV
Lead Channel Sensing Intrinsic Amplitude: 1.25 mV
Lead Channel Sensing Intrinsic Amplitude: 28 mV
Lead Channel Sensing Intrinsic Amplitude: 28 mV
Lead Channel Setting Pacing Amplitude: 2 V
Lead Channel Setting Pacing Amplitude: 2.5 V
Lead Channel Setting Pacing Pulse Width: 0.4 ms
Lead Channel Setting Sensing Sensitivity: 1.2 mV
Zone Setting Status: 755011
Zone Setting Status: 755011

## 2024-05-10 NOTE — Patient Instructions (Signed)
 Medication Instructions:  No medication changes today. *If you need a refill on your cardiac medications before your next appointment, please call your pharmacy*  Lab Work: No labwork ordered today. If you have labs (blood work) drawn today and your tests are completely normal, you will receive your results only by: MyChart Message (if you have MyChart) OR A paper copy in the mail If you have any lab test that is abnormal or we need to change your treatment, we will call you to review the results.  Testing/Procedures: No testing ordered today  Follow-Up: At [facility], you and your health needs are our priority.  As part of our continuing mission to provide you with exceptional heart care, our providers are all part of one team.  This team includes your primary Cardiologist (physician) and Advanced Practice Providers or APPs (Physician Assistants and Nurse Practitioners) who all work together to provide you with the care you need, when you need it.  Your next appointment:   12 month(s)  Provider:   You may see Manya Sells, MD or one of the following Advanced Practice Providers on your designated Care Team:   Mertha Abrahams, Kennard Pea "Jonelle Neri" [place], PA-C Suzann Riddle, NP Creighton Doffing, NP    We recommend signing up for the patient portal called "MyChart".  Sign up information is provided on this After Visit Summary.  MyChart is used to connect with patients for Virtual Visits (Telemedicine).  Patients are able to view lab/test results, encounter notes, upcoming appointments, etc.  Non-urgent messages can be sent to your provider as well.   To learn more about what you can do with MyChart, go to [email or web address].

## 2024-05-26 NOTE — Progress Notes (Signed)
 Remote pacemaker transmission.

## 2024-07-11 ENCOUNTER — Ambulatory Visit: Payer: Medicare HMO

## 2024-07-11 DIAGNOSIS — I442 Atrioventricular block, complete: Secondary | ICD-10-CM

## 2024-07-12 LAB — CUP PACEART REMOTE DEVICE CHECK
Battery Remaining Longevity: 95 mo
Battery Voltage: 3 V
Brady Statistic AP VP Percent: 60.03 %
Brady Statistic AP VS Percent: 0 %
Brady Statistic AS VP Percent: 39.97 %
Brady Statistic AS VS Percent: 0 %
Brady Statistic RA Percent Paced: 59.99 %
Brady Statistic RV Percent Paced: 100 %
Date Time Interrogation Session: 20250728223508
Implantable Lead Connection Status: 753985
Implantable Lead Connection Status: 753985
Implantable Lead Implant Date: 20221029
Implantable Lead Implant Date: 20221029
Implantable Lead Location: 753859
Implantable Lead Location: 753860
Implantable Lead Model: 3830
Implantable Lead Model: 5076
Implantable Pulse Generator Implant Date: 20221029
Lead Channel Impedance Value: 285 Ohm
Lead Channel Impedance Value: 380 Ohm
Lead Channel Impedance Value: 399 Ohm
Lead Channel Impedance Value: 551 Ohm
Lead Channel Pacing Threshold Amplitude: 0.5 V
Lead Channel Pacing Threshold Amplitude: 0.75 V
Lead Channel Pacing Threshold Pulse Width: 0.4 ms
Lead Channel Pacing Threshold Pulse Width: 0.4 ms
Lead Channel Sensing Intrinsic Amplitude: 1.125 mV
Lead Channel Sensing Intrinsic Amplitude: 1.125 mV
Lead Channel Sensing Intrinsic Amplitude: 28 mV
Lead Channel Sensing Intrinsic Amplitude: 28 mV
Lead Channel Setting Pacing Amplitude: 2 V
Lead Channel Setting Pacing Amplitude: 2.5 V
Lead Channel Setting Pacing Pulse Width: 0.4 ms
Lead Channel Setting Sensing Sensitivity: 1.2 mV
Zone Setting Status: 755011
Zone Setting Status: 755011

## 2024-07-14 ENCOUNTER — Ambulatory Visit: Payer: Self-pay | Admitting: Internal Medicine

## 2024-09-13 NOTE — Progress Notes (Signed)
 Remote PPM Transmission

## 2024-10-10 ENCOUNTER — Ambulatory Visit (INDEPENDENT_AMBULATORY_CARE_PROVIDER_SITE_OTHER)

## 2024-10-10 DIAGNOSIS — I480 Paroxysmal atrial fibrillation: Secondary | ICD-10-CM | POA: Diagnosis not present

## 2024-10-11 LAB — CUP PACEART REMOTE DEVICE CHECK
Battery Remaining Longevity: 94 mo
Battery Voltage: 3 V
Brady Statistic AP VP Percent: 60.2 %
Brady Statistic AP VS Percent: 0 %
Brady Statistic AS VP Percent: 39.79 %
Brady Statistic AS VS Percent: 0 %
Brady Statistic RA Percent Paced: 60.16 %
Brady Statistic RV Percent Paced: 100 %
Date Time Interrogation Session: 20251028035146
Implantable Lead Connection Status: 753985
Implantable Lead Connection Status: 753985
Implantable Lead Implant Date: 20221029
Implantable Lead Implant Date: 20221029
Implantable Lead Location: 753859
Implantable Lead Location: 753860
Implantable Lead Model: 3830
Implantable Lead Model: 5076
Implantable Pulse Generator Implant Date: 20221029
Lead Channel Impedance Value: 304 Ohm
Lead Channel Impedance Value: 399 Ohm
Lead Channel Impedance Value: 418 Ohm
Lead Channel Impedance Value: 551 Ohm
Lead Channel Pacing Threshold Amplitude: 0.5 V
Lead Channel Pacing Threshold Amplitude: 0.75 V
Lead Channel Pacing Threshold Pulse Width: 0.4 ms
Lead Channel Pacing Threshold Pulse Width: 0.4 ms
Lead Channel Sensing Intrinsic Amplitude: 1.875 mV
Lead Channel Sensing Intrinsic Amplitude: 1.875 mV
Lead Channel Sensing Intrinsic Amplitude: 28 mV
Lead Channel Sensing Intrinsic Amplitude: 28 mV
Lead Channel Setting Pacing Amplitude: 2 V
Lead Channel Setting Pacing Amplitude: 2.5 V
Lead Channel Setting Pacing Pulse Width: 0.4 ms
Lead Channel Setting Sensing Sensitivity: 1.2 mV
Zone Setting Status: 755011
Zone Setting Status: 755011

## 2024-10-17 NOTE — Progress Notes (Signed)
 Remote PPM Transmission

## 2024-10-19 ENCOUNTER — Ambulatory Visit: Payer: Self-pay | Admitting: Internal Medicine

## 2025-01-09 ENCOUNTER — Ambulatory Visit

## 2025-01-09 DIAGNOSIS — I442 Atrioventricular block, complete: Secondary | ICD-10-CM

## 2025-01-09 LAB — CUP PACEART REMOTE DEVICE CHECK
Battery Remaining Longevity: 91 mo
Battery Voltage: 3 V
Brady Statistic AP VP Percent: 63.61 %
Brady Statistic AP VS Percent: 0 %
Brady Statistic AS VP Percent: 36.38 %
Brady Statistic AS VS Percent: 0.01 %
Brady Statistic RA Percent Paced: 63.58 %
Brady Statistic RV Percent Paced: 99.99 %
Date Time Interrogation Session: 20260126202526
Implantable Lead Connection Status: 753985
Implantable Lead Connection Status: 753985
Implantable Lead Implant Date: 20221029
Implantable Lead Implant Date: 20221029
Implantable Lead Location: 753859
Implantable Lead Location: 753860
Implantable Lead Model: 3830
Implantable Lead Model: 5076
Implantable Pulse Generator Implant Date: 20221029
Lead Channel Impedance Value: 304 Ohm
Lead Channel Impedance Value: 418 Ohm
Lead Channel Impedance Value: 418 Ohm
Lead Channel Impedance Value: 570 Ohm
Lead Channel Pacing Threshold Amplitude: 0.5 V
Lead Channel Pacing Threshold Amplitude: 0.75 V
Lead Channel Pacing Threshold Pulse Width: 0.4 ms
Lead Channel Pacing Threshold Pulse Width: 0.4 ms
Lead Channel Sensing Intrinsic Amplitude: 1.25 mV
Lead Channel Sensing Intrinsic Amplitude: 1.25 mV
Lead Channel Sensing Intrinsic Amplitude: 28 mV
Lead Channel Sensing Intrinsic Amplitude: 28 mV
Lead Channel Setting Pacing Amplitude: 2 V
Lead Channel Setting Pacing Amplitude: 2.5 V
Lead Channel Setting Pacing Pulse Width: 0.4 ms
Lead Channel Setting Sensing Sensitivity: 1.2 mV
Zone Setting Status: 755011
Zone Setting Status: 755011

## 2025-01-11 ENCOUNTER — Ambulatory Visit: Payer: Self-pay | Admitting: Cardiovascular Disease

## 2025-01-18 NOTE — Progress Notes (Signed)
 Remote PPM Transmission

## 2025-04-10 ENCOUNTER — Ambulatory Visit

## 2025-07-10 ENCOUNTER — Ambulatory Visit

## 2025-10-09 ENCOUNTER — Ambulatory Visit
